# Patient Record
Sex: Male | Born: 1970
Health system: Southern US, Community
[De-identification: ages and names within clinical notes are randomized; demographics above are authoritative.]

## PROBLEM LIST (undated history)

## (undated) DIAGNOSIS — I10 Essential (primary) hypertension: Secondary | ICD-10-CM

## (undated) HISTORY — DX: Essential (primary) hypertension: I10

---

## 2003-02-02 HISTORY — PX: ANTERIOR FUSION CERVICAL SPINE: SUR626

## 2017-09-24 DIAGNOSIS — Z6827 Body mass index (BMI) 27.0-27.9, adult: Secondary | ICD-10-CM | POA: Diagnosis not present

## 2017-09-24 DIAGNOSIS — I10 Essential (primary) hypertension: Secondary | ICD-10-CM | POA: Diagnosis not present

## 2017-09-24 DIAGNOSIS — F17200 Nicotine dependence, unspecified, uncomplicated: Secondary | ICD-10-CM | POA: Diagnosis not present

## 2017-09-26 DIAGNOSIS — M542 Cervicalgia: Secondary | ICD-10-CM | POA: Diagnosis not present

## 2017-09-26 DIAGNOSIS — I10 Essential (primary) hypertension: Secondary | ICD-10-CM | POA: Diagnosis not present

## 2017-09-26 DIAGNOSIS — G2581 Restless legs syndrome: Secondary | ICD-10-CM | POA: Diagnosis not present

## 2017-09-26 DIAGNOSIS — Z6829 Body mass index (BMI) 29.0-29.9, adult: Secondary | ICD-10-CM | POA: Diagnosis not present

## 2017-09-26 DIAGNOSIS — H1012 Acute atopic conjunctivitis, left eye: Secondary | ICD-10-CM | POA: Diagnosis not present

## 2017-12-03 DIAGNOSIS — F17200 Nicotine dependence, unspecified, uncomplicated: Secondary | ICD-10-CM | POA: Diagnosis not present

## 2017-12-03 DIAGNOSIS — M5135 Other intervertebral disc degeneration, thoracolumbar region: Secondary | ICD-10-CM | POA: Diagnosis not present

## 2017-12-03 DIAGNOSIS — W010XXA Fall on same level from slipping, tripping and stumbling without subsequent striking against object, initial encounter: Secondary | ICD-10-CM | POA: Diagnosis not present

## 2017-12-03 DIAGNOSIS — M545 Low back pain: Secondary | ICD-10-CM | POA: Diagnosis not present

## 2017-12-03 DIAGNOSIS — S3992XA Unspecified injury of lower back, initial encounter: Secondary | ICD-10-CM | POA: Diagnosis not present

## 2017-12-06 DIAGNOSIS — M545 Low back pain: Secondary | ICD-10-CM | POA: Diagnosis not present

## 2017-12-06 DIAGNOSIS — Z6830 Body mass index (BMI) 30.0-30.9, adult: Secondary | ICD-10-CM | POA: Diagnosis not present

## 2017-12-20 DIAGNOSIS — M545 Low back pain: Secondary | ICD-10-CM | POA: Diagnosis not present

## 2017-12-26 DIAGNOSIS — M79604 Pain in right leg: Secondary | ICD-10-CM | POA: Diagnosis not present

## 2017-12-26 DIAGNOSIS — M5416 Radiculopathy, lumbar region: Secondary | ICD-10-CM | POA: Diagnosis not present

## 2017-12-26 DIAGNOSIS — M545 Low back pain: Secondary | ICD-10-CM | POA: Diagnosis not present

## 2017-12-26 DIAGNOSIS — M6281 Muscle weakness (generalized): Secondary | ICD-10-CM | POA: Diagnosis not present

## 2017-12-27 DIAGNOSIS — Z0000 Encounter for general adult medical examination without abnormal findings: Secondary | ICD-10-CM | POA: Diagnosis not present

## 2017-12-27 DIAGNOSIS — Z6830 Body mass index (BMI) 30.0-30.9, adult: Secondary | ICD-10-CM | POA: Diagnosis not present

## 2017-12-28 DIAGNOSIS — M79604 Pain in right leg: Secondary | ICD-10-CM | POA: Diagnosis not present

## 2017-12-28 DIAGNOSIS — M6281 Muscle weakness (generalized): Secondary | ICD-10-CM | POA: Diagnosis not present

## 2017-12-28 DIAGNOSIS — M545 Low back pain: Secondary | ICD-10-CM | POA: Diagnosis not present

## 2017-12-28 DIAGNOSIS — M5416 Radiculopathy, lumbar region: Secondary | ICD-10-CM | POA: Diagnosis not present

## 2018-01-05 DIAGNOSIS — M79604 Pain in right leg: Secondary | ICD-10-CM | POA: Diagnosis not present

## 2018-01-05 DIAGNOSIS — M545 Low back pain: Secondary | ICD-10-CM | POA: Diagnosis not present

## 2018-01-05 DIAGNOSIS — M6281 Muscle weakness (generalized): Secondary | ICD-10-CM | POA: Diagnosis not present

## 2018-01-05 DIAGNOSIS — M5416 Radiculopathy, lumbar region: Secondary | ICD-10-CM | POA: Diagnosis not present

## 2018-01-11 DIAGNOSIS — M79604 Pain in right leg: Secondary | ICD-10-CM | POA: Diagnosis not present

## 2018-01-11 DIAGNOSIS — M545 Low back pain: Secondary | ICD-10-CM | POA: Diagnosis not present

## 2018-01-11 DIAGNOSIS — M6281 Muscle weakness (generalized): Secondary | ICD-10-CM | POA: Diagnosis not present

## 2018-01-11 DIAGNOSIS — M5416 Radiculopathy, lumbar region: Secondary | ICD-10-CM | POA: Diagnosis not present

## 2018-01-12 DIAGNOSIS — M6281 Muscle weakness (generalized): Secondary | ICD-10-CM | POA: Diagnosis not present

## 2018-01-12 DIAGNOSIS — M5416 Radiculopathy, lumbar region: Secondary | ICD-10-CM | POA: Diagnosis not present

## 2018-01-12 DIAGNOSIS — M79604 Pain in right leg: Secondary | ICD-10-CM | POA: Diagnosis not present

## 2018-01-12 DIAGNOSIS — M545 Low back pain: Secondary | ICD-10-CM | POA: Diagnosis not present

## 2018-01-19 DIAGNOSIS — M6281 Muscle weakness (generalized): Secondary | ICD-10-CM | POA: Diagnosis not present

## 2018-01-19 DIAGNOSIS — M545 Low back pain: Secondary | ICD-10-CM | POA: Diagnosis not present

## 2018-01-19 DIAGNOSIS — M79604 Pain in right leg: Secondary | ICD-10-CM | POA: Diagnosis not present

## 2018-01-19 DIAGNOSIS — M5416 Radiculopathy, lumbar region: Secondary | ICD-10-CM | POA: Diagnosis not present

## 2018-01-24 DIAGNOSIS — M5416 Radiculopathy, lumbar region: Secondary | ICD-10-CM | POA: Diagnosis not present

## 2018-01-24 DIAGNOSIS — M6281 Muscle weakness (generalized): Secondary | ICD-10-CM | POA: Diagnosis not present

## 2018-01-24 DIAGNOSIS — M79604 Pain in right leg: Secondary | ICD-10-CM | POA: Diagnosis not present

## 2018-01-24 DIAGNOSIS — M545 Low back pain: Secondary | ICD-10-CM | POA: Diagnosis not present

## 2018-01-26 DIAGNOSIS — M6281 Muscle weakness (generalized): Secondary | ICD-10-CM | POA: Diagnosis not present

## 2018-01-26 DIAGNOSIS — M79604 Pain in right leg: Secondary | ICD-10-CM | POA: Diagnosis not present

## 2018-01-26 DIAGNOSIS — M5416 Radiculopathy, lumbar region: Secondary | ICD-10-CM | POA: Diagnosis not present

## 2018-01-26 DIAGNOSIS — M545 Low back pain: Secondary | ICD-10-CM | POA: Diagnosis not present

## 2018-01-30 DIAGNOSIS — M545 Low back pain: Secondary | ICD-10-CM | POA: Diagnosis not present

## 2018-01-30 DIAGNOSIS — M5416 Radiculopathy, lumbar region: Secondary | ICD-10-CM | POA: Diagnosis not present

## 2018-01-30 DIAGNOSIS — M79604 Pain in right leg: Secondary | ICD-10-CM | POA: Diagnosis not present

## 2018-01-30 DIAGNOSIS — M6281 Muscle weakness (generalized): Secondary | ICD-10-CM | POA: Diagnosis not present

## 2018-02-02 DIAGNOSIS — M545 Low back pain: Secondary | ICD-10-CM | POA: Diagnosis not present

## 2018-02-02 DIAGNOSIS — M5416 Radiculopathy, lumbar region: Secondary | ICD-10-CM | POA: Diagnosis not present

## 2018-02-02 DIAGNOSIS — M79604 Pain in right leg: Secondary | ICD-10-CM | POA: Diagnosis not present

## 2018-02-02 DIAGNOSIS — M6281 Muscle weakness (generalized): Secondary | ICD-10-CM | POA: Diagnosis not present

## 2018-03-29 DIAGNOSIS — I10 Essential (primary) hypertension: Secondary | ICD-10-CM | POA: Diagnosis not present

## 2018-03-29 DIAGNOSIS — Z6831 Body mass index (BMI) 31.0-31.9, adult: Secondary | ICD-10-CM | POA: Diagnosis not present

## 2018-03-29 DIAGNOSIS — E6609 Other obesity due to excess calories: Secondary | ICD-10-CM | POA: Diagnosis not present

## 2018-03-29 DIAGNOSIS — M545 Low back pain: Secondary | ICD-10-CM | POA: Diagnosis not present

## 2018-05-09 DIAGNOSIS — Z6832 Body mass index (BMI) 32.0-32.9, adult: Secondary | ICD-10-CM | POA: Diagnosis not present

## 2018-05-09 DIAGNOSIS — M542 Cervicalgia: Secondary | ICD-10-CM | POA: Diagnosis not present

## 2018-05-10 DIAGNOSIS — M542 Cervicalgia: Secondary | ICD-10-CM | POA: Diagnosis not present

## 2018-06-28 DIAGNOSIS — Z6831 Body mass index (BMI) 31.0-31.9, adult: Secondary | ICD-10-CM | POA: Diagnosis not present

## 2018-06-28 DIAGNOSIS — I10 Essential (primary) hypertension: Secondary | ICD-10-CM | POA: Diagnosis not present

## 2018-07-02 DIAGNOSIS — T447X6D Underdosing of beta-adrenoreceptor antagonists, subsequent encounter: Secondary | ICD-10-CM | POA: Diagnosis not present

## 2018-07-02 DIAGNOSIS — F17200 Nicotine dependence, unspecified, uncomplicated: Secondary | ICD-10-CM | POA: Diagnosis not present

## 2018-07-02 DIAGNOSIS — I10 Essential (primary) hypertension: Secondary | ICD-10-CM | POA: Diagnosis not present

## 2018-07-02 DIAGNOSIS — R480 Dyslexia and alexia: Secondary | ICD-10-CM | POA: Diagnosis not present

## 2018-07-02 DIAGNOSIS — M5416 Radiculopathy, lumbar region: Secondary | ICD-10-CM | POA: Diagnosis not present

## 2018-07-02 DIAGNOSIS — F320 Major depressive disorder, single episode, mild: Secondary | ICD-10-CM | POA: Diagnosis not present

## 2018-07-02 DIAGNOSIS — M4322 Fusion of spine, cervical region: Secondary | ICD-10-CM | POA: Diagnosis not present

## 2018-07-02 DIAGNOSIS — E669 Obesity, unspecified: Secondary | ICD-10-CM | POA: Diagnosis not present

## 2018-07-02 DIAGNOSIS — D224 Melanocytic nevi of scalp and neck: Secondary | ICD-10-CM | POA: Diagnosis not present

## 2018-09-28 DIAGNOSIS — Z131 Encounter for screening for diabetes mellitus: Secondary | ICD-10-CM | POA: Diagnosis not present

## 2018-09-28 DIAGNOSIS — Z1389 Encounter for screening for other disorder: Secondary | ICD-10-CM | POA: Diagnosis not present

## 2018-09-28 DIAGNOSIS — Z6832 Body mass index (BMI) 32.0-32.9, adult: Secondary | ICD-10-CM | POA: Diagnosis not present

## 2018-09-28 DIAGNOSIS — Z125 Encounter for screening for malignant neoplasm of prostate: Secondary | ICD-10-CM | POA: Diagnosis not present

## 2018-09-28 DIAGNOSIS — Z0000 Encounter for general adult medical examination without abnormal findings: Secondary | ICD-10-CM | POA: Diagnosis not present

## 2018-09-28 DIAGNOSIS — I10 Essential (primary) hypertension: Secondary | ICD-10-CM | POA: Diagnosis not present

## 2018-10-13 DIAGNOSIS — Z1211 Encounter for screening for malignant neoplasm of colon: Secondary | ICD-10-CM | POA: Diagnosis not present

## 2018-12-03 HISTORY — PX: COLONOSCOPY: SHX174

## 2018-12-11 DIAGNOSIS — Z01818 Encounter for other preprocedural examination: Secondary | ICD-10-CM | POA: Diagnosis not present

## 2018-12-13 DIAGNOSIS — D509 Iron deficiency anemia, unspecified: Secondary | ICD-10-CM | POA: Diagnosis not present

## 2018-12-13 DIAGNOSIS — Z539 Procedure and treatment not carried out, unspecified reason: Secondary | ICD-10-CM | POA: Diagnosis not present

## 2018-12-13 DIAGNOSIS — Z1211 Encounter for screening for malignant neoplasm of colon: Secondary | ICD-10-CM | POA: Diagnosis not present

## 2018-12-13 DIAGNOSIS — K2950 Unspecified chronic gastritis without bleeding: Secondary | ICD-10-CM | POA: Diagnosis not present

## 2018-12-25 ENCOUNTER — Other Ambulatory Visit: Payer: Self-pay | Admitting: Surgery

## 2018-12-25 DIAGNOSIS — Z1211 Encounter for screening for malignant neoplasm of colon: Secondary | ICD-10-CM

## 2019-01-09 ENCOUNTER — Encounter: Payer: Self-pay | Admitting: Gastroenterology

## 2019-01-31 NOTE — Progress Notes (Signed)
Referring Provider: Adelina Mings, MD Primary Care Physician:  Neale Burly, MD Primary Gastroenterologist:  Dr. Oneida Alar  Chief Complaint  Patient presents with  . Colonoscopy    HPI:   Caleb Richardson is a 48 y.o. male presenting today at the request of  Adelina Mings, MD for consult TCS. Previous incomplete colonoscopy at [facility] virtual colonoscopy was ordered.  Unable to view records.  Reviewed colonoscopy records.  Colonoscopy attempted on 12/13/2018 by Dr. Adelina Mings.  Impression states normal colonoscopy, retroflexed views revealed no abnormalities, incomplete colonoscopy.  Switching to a pediatric scope in position changes, could only advance the scope to a sending colon.  Recommended referral to GI for repeat colonoscopy.  Today: No complications with colonoscopy. States he thinks he was still passing formed stool after his prep. No history of constipation. Typically BM daily. Sometimes 2-3 times a day. Stools are soft and formed. No diarrhea. No abdominal pain. No blood in the stool. No black stools. No nausea, vomiting. Rare reflux symptoms if eating spaghetti. Otherwise, without heartburn/reflux. No dysphagia. Patient states he did not have the CT colonoscopy.   BP is elevated. States he gets anxious when he comes to the doctor. No HA or blurry vision. No CP.   Past Medical History:  Diagnosis Date  . HTN (hypertension)     Past Surgical History:  Procedure Laterality Date  . ANTERIOR FUSION CERVICAL SPINE  2005   c3-c5 fusion  . COLONOSCOPY  12/2018   [facility]; incomplete; normal to the ascending colon.     Current Outpatient Medications  Medication Sig Dispense Refill  . amLODipine (NORVASC) 5 MG tablet Take 5 mg by mouth daily.    . bisoprolol-hydrochlorothiazide (ZIAC) 10-6.25 MG tablet Take 1 tablet by mouth daily.     No current facility-administered medications for this visit.    Allergies as of 02/01/2019  . (No  Known Allergies)    Family History  Problem Relation Age of Onset  . Colon cancer Neg Hx     Social History   Socioeconomic History  . Marital status: Divorced    Spouse name: Not on file  . Number of children: Not on file  . Years of education: Not on file  . Highest education level: Not on file  Occupational History  . Not on file  Tobacco Use  . Smoking status: Current Every Day Smoker    Packs/day: 0.50  . Smokeless tobacco: Former Network engineer and Sexual Activity  . Alcohol use: Not Currently    Comment: no alcohol in 1-2 years.   . Drug use: Never  . Sexual activity: Not on file  Other Topics Concern  . Not on file  Social History Narrative  . Not on file   Social Determinants of Health   Financial Resource Strain:   . Difficulty of Paying Living Expenses: Not on file  Food Insecurity:   . Worried About Charity fundraiser in the Last Year: Not on file  . Ran Out of Food in the Last Year: Not on file  Transportation Needs:   . Lack of Transportation (Medical): Not on file  . Lack of Transportation (Non-Medical): Not on file  Physical Activity:   . Days of Exercise per Week: Not on file  . Minutes of Exercise per Session: Not on file  Stress:   . Feeling of Stress : Not on file  Social Connections:   . Frequency of Communication with Friends and  Family: Not on file  . Frequency of Social Gatherings with Friends and Family: Not on file  . Attends Religious Services: Not on file  . Active Member of Clubs or Organizations: Not on file  . Attends Archivist Meetings: Not on file  . Marital Status: Not on file  Intimate Partner Violence:   . Fear of Current or Ex-Partner: Not on file  . Emotionally Abused: Not on file  . Physically Abused: Not on file  . Sexually Abused: Not on file    Review of Systems: Gen: Denies any fever, chills, lightheadedness, dizziness, presyncope, or syncope. HEENT: Denies nasal congestion or sore throat. CV:  Denies chest pain or heart palpitations Resp: Denies shortness of breath or cough.  GI: See HPI GU : Denies urinary burning, urinary frequency, urinary hesitancy MS: Denies joint pain Derm: Denies rash Psych: Denies depression. Occasional anxiety.  Heme: Denies bruising, bleeding.   Physical Exam: BP (!) 160/90   Pulse 94   Temp (!) 97.3 F (36.3 C) (Oral)   Ht 6' (1.829 m)   Wt 240 lb 9.6 oz (109.1 kg)   BMI 32.63 kg/m  General:   Alert and oriented. Pleasant and cooperative. Well-nourished and well-developed.  Head:  Normocephalic and atraumatic. Eyes:  Without icterus, sclera clear and conjunctiva pink.  Ears:  Normal auditory acuity. Lungs:  Clear to auscultation bilaterally. No wheezes, rales, or rhonchi. No distress.  Heart:  S1, S2 present without murmurs appreciated.  Abdomen:  +BS, soft, non-tender and non-distended. No HSM noted. No guarding or rebound. No masses appreciated.  Rectal:  Deferred  Msk:  Symmetrical without gross deformities. Normal posture. Extremities:  Without edema. Neurologic:  Alert and  oriented x4;  grossly normal neurologically. Skin:  Intact without significant lesions or rashes. Psych: Normal mood and affect.

## 2019-02-01 ENCOUNTER — Ambulatory Visit (INDEPENDENT_AMBULATORY_CARE_PROVIDER_SITE_OTHER): Payer: Medicare PPO | Admitting: Gastroenterology

## 2019-02-01 ENCOUNTER — Telehealth: Payer: Self-pay | Admitting: *Deleted

## 2019-02-01 ENCOUNTER — Other Ambulatory Visit: Payer: Self-pay

## 2019-02-01 ENCOUNTER — Telehealth: Payer: Self-pay | Admitting: Gastroenterology

## 2019-02-01 ENCOUNTER — Encounter: Payer: Self-pay | Admitting: Gastroenterology

## 2019-02-01 DIAGNOSIS — Z1211 Encounter for screening for malignant neoplasm of colon: Secondary | ICD-10-CM

## 2019-02-01 NOTE — Assessment & Plan Note (Addendum)
48 year old male with past medical history of hypertension presenting at the request of Dr. Adelina Mings to repeat colonoscopy for colon cancer screening.  Patient had colonoscopy on 12/13/2018 performed by Dr. Rocco Serene at [facility] that was normal to the ascending colon but could not advance the scope further.  No explanation as to why.  Patient does report that he was still passing some formed stool after completing his colon prep.  No history of constipation.  Typically having daily BMs, sometimes 2-3.  No significant upper or lower GI symptoms.  No alarm symptoms.  No family history of colon cancer. Appears he was going to be scheduled for CT virtual colonoscopy but patient states he did not have this completed.   At this time, will proceed with colonoscopy with Dr. Oneida Alar in the near future. The risks, benefits, and alternatives have been discussed in detail with patient. They have stated understanding and desire to proceed.  Due to possible poor prep, will have patient complete an additional day of clear liquids as well as additional 1/2 prep. Follow-up as recommended at the time of colonoscopy.  Of note, patient's blood pressure was elevated today at 160/90.  States he is anxious when coming to doctors offices.  No concerning symptoms from his elevated BP.  He was advised to follow-up with his primary care ASAP as this needs to be better managed.  Advised he get a automated BP cuff so he can monitor his blood pressure at home.  Will have clinical staff call 4 weeks prior to his colonoscopy to check on BP.

## 2019-02-01 NOTE — Telephone Encounter (Signed)
error 

## 2019-02-01 NOTE — Telephone Encounter (Signed)
Pt was returning a call. [phone]

## 2019-02-01 NOTE — Telephone Encounter (Signed)
LMOVM

## 2019-02-01 NOTE — Telephone Encounter (Signed)
See prior note

## 2019-02-01 NOTE — Patient Instructions (Signed)
Please call your primary care ASAP to follow-up on your elevated blood pressure.  This is going to need to be better managed by the time of your colonoscopy.  Advised that you pick up a automated blood pressure cuff from [facility] or other pharmacy to keep a check on your blood pressures at home.  We will get you scheduled for your colonoscopy in the near future with Dr. Oneida Alar.  I will have you complete an extended prep as you reported still having formed stool after completing your prep.  You will have an extra day of clear liquids and complete additional colon prep.  See separate instructions for this.  We will follow up with you as recommended at the time of colonoscopy.  Aliene Altes, PA-C [facility]

## 2019-02-01 NOTE — Telephone Encounter (Signed)
LMOVM for pt. Patient scheduled for CT virtual TCS 02/14/2019. Per [facility] patient can decided if he wants to continue with this or for TCS we have scheduled with [facility] on 05/17/2019

## 2019-02-05 NOTE — Telephone Encounter (Signed)
Noted  

## 2019-02-05 NOTE — Telephone Encounter (Signed)
Spoke with patient. He states he is going to stick with the virtual CT TCS that is already scheduled for 02/20/2019. He wants [place] to cancel what we have him down for with our office. FYI to [facility]

## 2019-02-05 NOTE — Telephone Encounter (Signed)
lmovm

## 2019-02-08 ENCOUNTER — Other Ambulatory Visit: Payer: Self-pay | Admitting: Surgery

## 2019-02-14 ENCOUNTER — Inpatient Hospital Stay: Admission: RE | Admit: 2019-02-14 | Payer: Self-pay | Source: Ambulatory Visit

## 2019-02-20 ENCOUNTER — Ambulatory Visit
Admission: RE | Admit: 2019-02-20 | Discharge: 2019-02-20 | Disposition: A | Discharge status: Home / Self Care | Payer: Medicare Other | Source: Ambulatory Visit | Attending: Surgery | Admitting: Surgery

## 2019-02-20 ENCOUNTER — Other Ambulatory Visit: Payer: Self-pay

## 2019-02-20 DIAGNOSIS — Z1211 Encounter for screening for malignant neoplasm of colon: Secondary | ICD-10-CM

## 2019-02-21 DIAGNOSIS — Z0000 Encounter for general adult medical examination without abnormal findings: Secondary | ICD-10-CM | POA: Diagnosis not present

## 2019-02-21 DIAGNOSIS — I10 Essential (primary) hypertension: Secondary | ICD-10-CM | POA: Diagnosis not present

## 2019-02-26 DIAGNOSIS — Z041 Encounter for examination and observation following transport accident: Secondary | ICD-10-CM | POA: Diagnosis not present

## 2019-02-26 DIAGNOSIS — I10 Essential (primary) hypertension: Secondary | ICD-10-CM | POA: Diagnosis not present

## 2019-02-26 DIAGNOSIS — Z79899 Other long term (current) drug therapy: Secondary | ICD-10-CM | POA: Diagnosis not present

## 2019-05-16 ENCOUNTER — Other Ambulatory Visit (HOSPITAL_COMMUNITY): Payer: Medicare PPO

## 2019-06-06 DIAGNOSIS — I10 Essential (primary) hypertension: Secondary | ICD-10-CM | POA: Diagnosis not present

## 2019-09-27 ENCOUNTER — Telehealth: Payer: Self-pay | Admitting: Gastroenterology

## 2019-09-27 DIAGNOSIS — I10 Essential (primary) hypertension: Secondary | ICD-10-CM | POA: Diagnosis not present

## 2019-09-27 DIAGNOSIS — Z131 Encounter for screening for diabetes mellitus: Secondary | ICD-10-CM | POA: Diagnosis not present

## 2019-09-27 NOTE — Telephone Encounter (Signed)
Reviewed chart- Kristen saw pt and wanted to schedule a tcs with Dr.Fields. pt was referred to have a virtual tcs by [facility] surgical specialist in [place]. And the pt chose virtual tcs instead of tcs with Dr.Fields. see phone notes.  We would not have gotten those results because we did not order it. I tried to call the pt back, NA- LM on voicemail. Advised him on message that he needs to call [facility] surgical specialist in [place] since they are the ones that ordered the test. Routing to [facility] as [employer]

## 2019-09-27 NOTE — Telephone Encounter (Signed)
Spoke with the pt and he is aware to call [facility]. I gave him the number to call.

## 2019-09-27 NOTE — Telephone Encounter (Signed)
PATIENT CAME INTO THE OFFICE AND SAID THAT WE SENT HIM TO [facility] LAST YEAR FOR A COLONOSCOPY AND THAT HE NEVER HEARD RESULTS.  I ASKED THE PATIENT IF HE CONTACTED THEM AND HE SAID HE WAS TOLD IT WAS STILL "BEING PROCESSED" [phone]

## 2019-09-27 NOTE — Telephone Encounter (Signed)
Noted. Agree with recommendations.  

## 2019-10-03 ENCOUNTER — Telehealth: Payer: Self-pay | Admitting: Internal Medicine

## 2019-10-03 NOTE — Telephone Encounter (Signed)
Pt said he was in the office recently and was wondering if he could go ahead and schedule his colonoscopy now. I told him the scheduler would be in touch with him once the doctor's schedule is available. [phone]

## 2019-10-04 NOTE — Telephone Encounter (Signed)
See other phone note from 09/27/19. Pt's last OV was 01/2019.

## 2019-12-13 ENCOUNTER — Telehealth (INDEPENDENT_AMBULATORY_CARE_PROVIDER_SITE_OTHER): Payer: Self-pay

## 2019-12-13 ENCOUNTER — Ambulatory Visit (INDEPENDENT_AMBULATORY_CARE_PROVIDER_SITE_OTHER): Payer: Medicare Other | Admitting: Gastroenterology

## 2019-12-13 NOTE — Telephone Encounter (Signed)
Patient no showed for her appointment on 12/13/2019.

## 2019-12-14 NOTE — Telephone Encounter (Signed)
noted 

## 2020-01-22 DIAGNOSIS — I10 Essential (primary) hypertension: Secondary | ICD-10-CM | POA: Diagnosis not present

## 2020-01-22 DIAGNOSIS — Z0000 Encounter for general adult medical examination without abnormal findings: Secondary | ICD-10-CM | POA: Diagnosis not present

## 2020-03-13 ENCOUNTER — Telehealth (INDEPENDENT_AMBULATORY_CARE_PROVIDER_SITE_OTHER): Payer: Self-pay

## 2020-03-13 ENCOUNTER — Encounter (INDEPENDENT_AMBULATORY_CARE_PROVIDER_SITE_OTHER): Payer: Self-pay | Admitting: Gastroenterology

## 2020-03-13 ENCOUNTER — Encounter (INDEPENDENT_AMBULATORY_CARE_PROVIDER_SITE_OTHER): Payer: Self-pay

## 2020-03-13 ENCOUNTER — Other Ambulatory Visit: Payer: Self-pay

## 2020-03-13 ENCOUNTER — Other Ambulatory Visit (INDEPENDENT_AMBULATORY_CARE_PROVIDER_SITE_OTHER): Payer: Self-pay

## 2020-03-13 ENCOUNTER — Ambulatory Visit (INDEPENDENT_AMBULATORY_CARE_PROVIDER_SITE_OTHER): Payer: Medicare HMO | Admitting: Gastroenterology

## 2020-03-13 VITALS — BP 135/95 | HR 103 | Temp 98.3°F | Ht 72.0 in | Wt 239.0 lb

## 2020-03-13 DIAGNOSIS — Z7189 Other specified counseling: Secondary | ICD-10-CM

## 2020-03-13 DIAGNOSIS — Z1211 Encounter for screening for malignant neoplasm of colon: Secondary | ICD-10-CM

## 2020-03-13 MED ORDER — PEG 3350-KCL-NA BICARB-NACL 420 G PO SOLR: 4000.0000 mL | ORAL | 0 refills | Status: DC

## 2020-03-13 NOTE — H&P (View-Only) (Signed)
Maylon Peppers, M.D. Gastroenterology & Hepatology [facility] [address] [place], [place] [address] Primary Care Physician: Neale Burly, MD [place] [address]  Referring MD: PCP  Chief Complaint: Incomplete colonoscopy  History of Present Illness: Caleb Richardson is a 50 y.o. male with PMH HTN, who presents for evaluation of incomplete colonoscopy.  Patient denies having any complaints at the moment.  He states that on a regular day he has up to 3 BM which have normal consistency. Patient reports that for the last year he has presented up to 5 bowel movements when he eats chocolate, but it has normal consistency as well.  He denies having any discomfort such as abdominal pain or dyschezia with these bowel movements. The patient denies having any nausea, vomiting, fever, chills, hematochezia, melena, hematemesis, abdominal distention, abdominal pain, jaundice, pruritus or weight loss.  Last EGD: never. Last Colonoscopy:12/13/2018 - performed at [facility] Dr. Jory Sims, incomplete colonoscopy, visualized only until the ascending colon, this was followed by CT virtual colonoscopy on 02/20/2019 which had an incomplete prep but no large lesions were observed.  FHx: neg for any gastrointestinal/liver disease, no malignancies Social: smokes 1/2 packs since 50 years old, neg alcohol or illicit drug use Surgical: no abdominal surgeries  Past Medical History: Past Medical History:  Diagnosis Date  . HTN (hypertension)     Past Surgical History: Past Surgical History:  Procedure Laterality Date  . ANTERIOR FUSION CERVICAL SPINE  2005   c3-c5 fusion  . COLONOSCOPY  12/2018   [facility]; incomplete; normal to the ascending colon.     Family History: Family History  Problem Relation Age of Onset  . Diabetes Mother   . Healthy Father   . Colon cancer Neg Hx     Social History: Social History    Tobacco Use  Smoking Status Current Every Day Smoker  . Packs/day: 0.50  Smokeless Tobacco Former Systems developer  . Types: Snuff   Social History   Substance and Sexual Activity  Alcohol Use Not Currently   Comment: no alcohol in 1-2 years.    Social History   Substance and Sexual Activity  Drug Use Never    Allergies: Allergies  Allergen Reactions  . Avocado Anaphylaxis  . Bee Pollen Photosensitivity    Watery eyes  . Dust Mite Extract   . Mixed Ragweed Photosensitivity    Medications: Current Outpatient Medications  Medication Sig Dispense Refill  . amLODipine (NORVASC) 5 MG tablet Take 10 mg by mouth daily.    . bisoprolol-hydrochlorothiazide (ZIAC) 10-6.25 MG tablet Take 1 tablet by mouth daily. (Patient not taking: Reported on 03/13/2020)     No current facility-administered medications for this visit.    Review of Systems: GENERAL: negative for malaise, night sweats HEENT: No changes in hearing or vision, no nose bleeds or other nasal problems. NECK: Negative for lumps, goiter, pain and significant neck swelling RESPIRATORY: Negative for cough, wheezing CARDIOVASCULAR: Negative for chest pain, leg swelling, palpitations, orthopnea GI: SEE HPI MUSCULOSKELETAL: Negative for joint pain or swelling, back pain, and muscle pain. SKIN: Negative for lesions, rash PSYCH: Negative for sleep disturbance, mood disorder and recent psychosocial stressors. HEMATOLOGY Negative for prolonged bleeding, bruising easily, and swollen nodes. ENDOCRINE: Negative for cold or heat intolerance, polyuria, polydipsia and goiter. NEURO: negative for tremor, gait imbalance, syncope and seizures. The remainder of the review of systems is noncontributory.   Physical Exam: BP (!) 135/95 (BP Location: Left  Arm, Patient Position: Sitting, Cuff Size: Large)   Pulse (!) 103   Temp 98.3 F (36.8 C) (Oral)   Ht 6' (1.829 m)   Wt 239 lb (108.4 kg)   BMI 32.41 kg/m  GENERAL: The patient is AO x3,  in no acute distress. Obese. HEENT: Head is normocephalic and atraumatic. EOMI are intact. Mouth is well hydrated and without lesions. NECK: Supple. No masses LUNGS: Clear to auscultation. No presence of rhonchi/wheezing/rales. Adequate chest expansion HEART: RRR, normal s1 and s2. ABDOMEN: Soft, nontender, no guarding, no peritoneal signs, and nondistended. BS +. No masses. EXTREMITIES: Without any cyanosis, clubbing, rash, lesions or edema. NEUROLOGIC: AOx3, no focal motor deficit. SKIN: no jaundice, no rashes   Imaging/Labs: as above  I personally reviewed and interpreted the available labs, imaging and endoscopic files.  Impression and Plan: Caleb Richardson is a 50 y.o. male with PMH HTN, who presents for evaluation of incomplete colonoscopy.  Patient had visualization of the colon up to the ascending colon but the cecum could not be completely visualized.  He also had virtual colonoscopy that suboptimal prep.  Due to this, we will attempt a repeat colonoscopy at [facility].  More than 50% of the office visit was dedicated to discussing the procedure, including the day of and risks involved. Patient understands what the procedure involves including the benefits and any risks. Patient understands alternatives to the proposed procedure. Risks including (but not limited to) bleeding, tearing of the lining (perforation), rupture of adjacent organs, problems with heart and lung function, infection, and medication reactions. A small percentage of complications may require surgery, hospitalization, repeat endoscopic procedure, and/or transfusion. A small percentage of polyps and other tumors may not be seen. Patient understood and agreed.  - Schedule colonoscopy  All questions were answered.      Maylon Peppers, MD Gastroenterology and Hepatology [facility]

## 2020-03-13 NOTE — Telephone Encounter (Signed)
LeighAnn Nicodemus Denk, CMA  

## 2020-03-13 NOTE — Progress Notes (Signed)
Caleb Richardson, M.D. Gastroenterology & Hepatology [facility] [address] [place], [place] [address] Primary Care Physician: Caleb Burly, MD [place] [address]  Referring MD: PCP  Chief Complaint: Incomplete colonoscopy  History of Present Illness: Caleb Richardson is a 50 y.o. male with PMH HTN, who presents for evaluation of incomplete colonoscopy.  Patient denies having any complaints at the moment.  He states that on a regular day he has up to 3 BM which have normal consistency. Patient reports that for the last year he has presented up to 5 bowel movements when he eats chocolate, but it has normal consistency as well.  He denies having any discomfort such as abdominal pain or dyschezia with these bowel movements. The patient denies having any nausea, vomiting, fever, chills, hematochezia, melena, hematemesis, abdominal distention, abdominal pain, jaundice, pruritus or weight loss.  Last EGD: never. Last Colonoscopy:12/13/2018 - performed at [facility] Dr. Jory Richardson, incomplete colonoscopy, visualized only until the ascending colon, this was followed by CT virtual colonoscopy on 02/20/2019 which had an incomplete prep but no large lesions were observed.  FHx: neg for any gastrointestinal/liver disease, no malignancies Social: smokes 1/2 packs since 50 years old, neg alcohol or illicit drug use Surgical: no abdominal surgeries  Past Medical History: Past Medical History:  Diagnosis Date  . HTN (hypertension)     Past Surgical History: Past Surgical History:  Procedure Laterality Date  . ANTERIOR FUSION CERVICAL SPINE  2005   c3-c5 fusion  . COLONOSCOPY  12/2018   [facility]; incomplete; normal to the ascending colon.     Family History: Family History  Problem Relation Age of Onset  . Diabetes Mother   . Healthy Father   . Colon cancer Neg Hx     Social History: Social History    Tobacco Use  Smoking Status Current Every Day Smoker  . Packs/day: 0.50  Smokeless Tobacco Former Systems developer  . Types: Snuff   Social History   Substance and Sexual Activity  Alcohol Use Not Currently   Comment: no alcohol in 1-2 years.    Social History   Substance and Sexual Activity  Drug Use Never    Allergies: Allergies  Allergen Reactions  . Avocado Anaphylaxis  . Bee Pollen Photosensitivity    Watery eyes  . Dust Mite Extract   . Mixed Ragweed Photosensitivity    Medications: Current Outpatient Medications  Medication Sig Dispense Refill  . amLODipine (NORVASC) 5 MG tablet Take 10 mg by mouth daily.    . bisoprolol-hydrochlorothiazide (ZIAC) 10-6.25 MG tablet Take 1 tablet by mouth daily. (Patient not taking: Reported on 03/13/2020)     No current facility-administered medications for this visit.    Review of Systems: GENERAL: negative for malaise, night sweats HEENT: No changes in hearing or vision, no nose bleeds or other nasal problems. NECK: Negative for lumps, goiter, pain and significant neck swelling RESPIRATORY: Negative for cough, wheezing CARDIOVASCULAR: Negative for chest pain, leg swelling, palpitations, orthopnea GI: SEE HPI MUSCULOSKELETAL: Negative for joint pain or swelling, back pain, and muscle pain. SKIN: Negative for lesions, rash PSYCH: Negative for sleep disturbance, mood disorder and recent psychosocial stressors. HEMATOLOGY Negative for prolonged bleeding, bruising easily, and swollen nodes. ENDOCRINE: Negative for cold or heat intolerance, polyuria, polydipsia and goiter. NEURO: negative for tremor, gait imbalance, syncope and seizures. The remainder of the review of systems is noncontributory.   Physical Exam: BP (!) 135/95 (BP Location: Left  Arm, Patient Position: Sitting, Cuff Size: Large)   Pulse (!) 103   Temp 98.3 F (36.8 C) (Oral)   Ht 6' (1.829 m)   Wt 239 lb (108.4 kg)   BMI 32.41 kg/m  GENERAL: The patient is AO x3,  in no acute distress. Obese. HEENT: Head is normocephalic and atraumatic. EOMI are intact. Mouth is well hydrated and without lesions. NECK: Supple. No masses LUNGS: Clear to auscultation. No presence of rhonchi/wheezing/rales. Adequate chest expansion HEART: RRR, normal s1 and s2. ABDOMEN: Soft, nontender, no guarding, no peritoneal signs, and nondistended. BS +. No masses. EXTREMITIES: Without any cyanosis, clubbing, rash, lesions or edema. NEUROLOGIC: AOx3, no focal motor deficit. SKIN: no jaundice, no rashes   Imaging/Labs: as above  I personally reviewed and interpreted the available labs, imaging and endoscopic files.  Impression and Plan: Caleb Richardson is a 50 y.o. male with PMH HTN, who presents for evaluation of incomplete colonoscopy.  Patient had visualization of the colon up to the ascending colon but the cecum could not be completely visualized.  He also had virtual colonoscopy that suboptimal prep.  Due to this, we will attempt a repeat colonoscopy at [facility].  More than 50% of the office visit was dedicated to discussing the procedure, including the day of and risks involved. Patient understands what the procedure involves including the benefits and any risks. Patient understands alternatives to the proposed procedure. Risks including (but not limited to) bleeding, tearing of the lining (perforation), rupture of adjacent organs, problems with heart and lung function, infection, and medication reactions. A small percentage of complications may require surgery, hospitalization, repeat endoscopic procedure, and/or transfusion. A small percentage of polyps and other tumors may not be seen. Patient understood and agreed.  - Schedule colonoscopy  All questions were answered.      Caleb Peppers, MD Gastroenterology and Hepatology [facility]

## 2020-03-24 ENCOUNTER — Telehealth (INDEPENDENT_AMBULATORY_CARE_PROVIDER_SITE_OTHER): Payer: Self-pay

## 2020-03-24 ENCOUNTER — Other Ambulatory Visit: Payer: Self-pay

## 2020-03-24 ENCOUNTER — Encounter (HOSPITAL_COMMUNITY): Payer: Self-pay | Admitting: Gastroenterology

## 2020-03-24 ENCOUNTER — Other Ambulatory Visit (HOSPITAL_COMMUNITY)
Admission: RE | Admit: 2020-03-24 | Discharge: 2020-03-24 | Disposition: A | Discharge status: Home / Self Care | Payer: Medicare HMO | Source: Ambulatory Visit | Attending: Gastroenterology | Admitting: Gastroenterology

## 2020-03-24 DIAGNOSIS — D124 Benign neoplasm of descending colon: Secondary | ICD-10-CM | POA: Diagnosis not present

## 2020-03-24 DIAGNOSIS — Z833 Family history of diabetes mellitus: Secondary | ICD-10-CM | POA: Diagnosis not present

## 2020-03-24 DIAGNOSIS — Z79899 Other long term (current) drug therapy: Secondary | ICD-10-CM | POA: Diagnosis not present

## 2020-03-24 DIAGNOSIS — K5520 Angiodysplasia of colon without hemorrhage: Secondary | ICD-10-CM | POA: Diagnosis not present

## 2020-03-24 DIAGNOSIS — K648 Other hemorrhoids: Secondary | ICD-10-CM | POA: Diagnosis not present

## 2020-03-24 DIAGNOSIS — Z981 Arthrodesis status: Secondary | ICD-10-CM | POA: Diagnosis not present

## 2020-03-24 DIAGNOSIS — R69 Illness, unspecified: Secondary | ICD-10-CM | POA: Diagnosis not present

## 2020-03-24 DIAGNOSIS — D120 Benign neoplasm of cecum: Secondary | ICD-10-CM | POA: Diagnosis not present

## 2020-03-24 DIAGNOSIS — F17210 Nicotine dependence, cigarettes, uncomplicated: Secondary | ICD-10-CM | POA: Diagnosis not present

## 2020-03-24 DIAGNOSIS — Z20822 Contact with and (suspected) exposure to covid-19: Secondary | ICD-10-CM | POA: Insufficient documentation

## 2020-03-24 DIAGNOSIS — Z01812 Encounter for preprocedural laboratory examination: Secondary | ICD-10-CM | POA: Insufficient documentation

## 2020-03-24 DIAGNOSIS — Z1211 Encounter for screening for malignant neoplasm of colon: Secondary | ICD-10-CM | POA: Diagnosis not present

## 2020-03-24 DIAGNOSIS — I10 Essential (primary) hypertension: Secondary | ICD-10-CM | POA: Diagnosis not present

## 2020-03-24 LAB — SARS CORONAVIRUS 2 (TAT 6-24 HRS): SARS Coronavirus 2: NEGATIVE

## 2020-03-24 LAB — BASIC METABOLIC PANEL
Anion gap: 8 (ref 5–15)
BUN: 14 mg/dL (ref 6–20)
CO2: 28 mmol/L (ref 22–32)
Calcium: 9.2 mg/dL (ref 8.9–10.3)
Chloride: 107 mmol/L (ref 98–111)
Creatinine, Ser: 0.99 mg/dL (ref 0.61–1.24)
GFR, Estimated: >60 mL/min (ref >60)
Glucose, Bld: 105 mg/dL — ABNORMAL HIGH (ref 70–99)
Potassium: 4 mmol/L (ref 3.5–5.1)
Sodium: 143 mmol/L (ref 135–145)

## 2020-03-24 MED ORDER — PEG 3350-KCL-NA BICARB-NACL 420 G PO SOLR: 4000.0000 mL | ORAL | 0 refills | Status: DC

## 2020-03-24 NOTE — Telephone Encounter (Signed)
Caleb Richardson, CMA  

## 2020-03-25 ENCOUNTER — Ambulatory Visit (HOSPITAL_COMMUNITY)
Admission: RE | Admit: 2020-03-25 | Discharge: 2020-03-25 | Disposition: A | Discharge status: Home / Self Care | Payer: Medicare HMO | Attending: Gastroenterology | Admitting: Gastroenterology

## 2020-03-25 ENCOUNTER — Ambulatory Visit (HOSPITAL_COMMUNITY): Payer: Medicare HMO | Admitting: Certified Registered Nurse Anesthetist

## 2020-03-25 ENCOUNTER — Encounter (HOSPITAL_COMMUNITY)
Admission: RE | Disposition: A | Discharge status: Home / Self Care | Payer: Self-pay | Source: Home / Self Care | Attending: Gastroenterology

## 2020-03-25 ENCOUNTER — Other Ambulatory Visit: Payer: Self-pay

## 2020-03-25 ENCOUNTER — Encounter (HOSPITAL_COMMUNITY): Payer: Self-pay | Admitting: Gastroenterology

## 2020-03-25 DIAGNOSIS — K648 Other hemorrhoids: Secondary | ICD-10-CM

## 2020-03-25 DIAGNOSIS — D123 Benign neoplasm of transverse colon: Secondary | ICD-10-CM

## 2020-03-25 DIAGNOSIS — Z981 Arthrodesis status: Secondary | ICD-10-CM | POA: Diagnosis not present

## 2020-03-25 DIAGNOSIS — Z79899 Other long term (current) drug therapy: Secondary | ICD-10-CM | POA: Diagnosis not present

## 2020-03-25 DIAGNOSIS — Z1211 Encounter for screening for malignant neoplasm of colon: Secondary | ICD-10-CM | POA: Diagnosis not present

## 2020-03-25 DIAGNOSIS — Z833 Family history of diabetes mellitus: Secondary | ICD-10-CM | POA: Diagnosis not present

## 2020-03-25 DIAGNOSIS — D120 Benign neoplasm of cecum: Secondary | ICD-10-CM | POA: Diagnosis not present

## 2020-03-25 DIAGNOSIS — D124 Benign neoplasm of descending colon: Secondary | ICD-10-CM

## 2020-03-25 DIAGNOSIS — F17210 Nicotine dependence, cigarettes, uncomplicated: Secondary | ICD-10-CM | POA: Insufficient documentation

## 2020-03-25 DIAGNOSIS — K635 Polyp of colon: Secondary | ICD-10-CM | POA: Diagnosis not present

## 2020-03-25 DIAGNOSIS — K5520 Angiodysplasia of colon without hemorrhage: Secondary | ICD-10-CM | POA: Diagnosis not present

## 2020-03-25 DIAGNOSIS — R69 Illness, unspecified: Secondary | ICD-10-CM | POA: Diagnosis not present

## 2020-03-25 DIAGNOSIS — I10 Essential (primary) hypertension: Secondary | ICD-10-CM | POA: Diagnosis not present

## 2020-03-25 HISTORY — PX: COLONOSCOPY WITH PROPOFOL: SHX5780

## 2020-03-25 HISTORY — PX: POLYPECTOMY: SHX5525

## 2020-03-25 LAB — HM COLONOSCOPY

## 2020-03-25 SURGERY — COLONOSCOPY WITH PROPOFOL
Anesthesia: General

## 2020-03-25 MED ORDER — LIDOCAINE HCL (PF) 2 % IJ SOLN
INTRAMUSCULAR | Status: AC
  Filled 2020-03-25: qty 5

## 2020-03-25 MED ORDER — LACTATED RINGERS IV SOLN: INTRAVENOUS | Status: DC

## 2020-03-25 MED ORDER — LIDOCAINE HCL (PF) 2 % IJ SOLN
INTRAMUSCULAR | Status: DC | PRN
  Administered 2020-03-25: 50 mg

## 2020-03-25 MED ORDER — PROPOFOL 10 MG/ML IV BOLUS
INTRAVENOUS | Status: DC | PRN
  Administered 2020-03-25: 100 mg via INTRAVENOUS

## 2020-03-25 MED ORDER — PROPOFOL 10 MG/ML IV BOLUS
INTRAVENOUS | Status: AC
  Filled 2020-03-25: qty 40

## 2020-03-25 MED ORDER — SODIUM CHLORIDE FLUSH 0.9 % IV SOLN
INTRAVENOUS | Status: AC
  Filled 2020-03-25: qty 10

## 2020-03-25 MED ORDER — PROPOFOL 500 MG/50ML IV EMUL
INTRAVENOUS | Status: DC | PRN
  Administered 2020-03-25: 100 ug/kg/min via INTRAVENOUS

## 2020-03-25 NOTE — Interval H&P Note (Signed)
History and Physical Interval Note:  03/25/2020 11:29 AM  Caleb Richardson is a 50 y.o. male with PMH HTN, who presents to the hospital for [facility] screening after incomplete colonoscopies.  Patient has been asymptomatic.  Denies having any nausea, vomiting, fever, chills, melena or hematochezia.  Has not had any weight loss.  He denies any family history of colorectal cancer.  Last Colonoscopy:12/13/2018 - performed at [facility] Dr. Jory Sims, incomplete colonoscopy, visualized only until the ascending colon, this was followed by CT virtual colonoscopy on 02/20/2019 which had an incomplete prep but no large lesions were observed.  Caleb Richardson  has presented today for surgery, with the diagnosis of Screening Colonoscopy.  The various methods of treatment have been discussed with the patient and family. After consideration of risks, benefits and other options for treatment, the patient has consented to  Procedure(s) with comments: COLONOSCOPY WITH PROPOFOL (N/A) - 11:30 as a surgical intervention.  The patient's history has been reviewed, patient examined, no change in status, stable for surgery.  I have reviewed the patient's chart and labs.  Questions were answered to the patient's satisfaction.     Caleb Richardson

## 2020-03-25 NOTE — Op Note (Signed)
[facility] Patient Name: Caleb Richardson Procedure Date: 03/25/2020 12:36 PM MRN: [id number] Date of Birth: 19-Apr-1970 Attending MD: Maylon Peppers ,  CSN: [id number] Age: 50 Admit Type: Outpatient Procedure:                Colonoscopy Indications:              Screening for colorectal malignant neoplasm,                            history of incomplete colonoscopies Providers:                Maylon Peppers, Janeece Riggers, RN, Kristine L.                            Risa Grill, Technician Referring MD:              Medicines:                Monitored Anesthesia Care Complications:            No immediate complications. Estimated Blood Loss:     Estimated blood loss: none. Procedure:                Pre-Anesthesia Assessment:                           - Prior to the procedure, a History and Physical                            was performed, and patient medications, allergies                            and sensitivities were reviewed. The patient's                            tolerance of previous anesthesia was reviewed.                           - The risks and benefits of the procedure and the                            sedation options and risks were discussed with the                            patient. All questions were answered and informed                            consent was obtained.                           - ASA Grade Assessment: II - A patient with mild                            systemic disease.                           After obtaining informed consent, the colonoscope  was passed under direct vision. Throughout the                            procedure, the patient's blood pressure, pulse, and                            oxygen saturations were monitored continuously. The                            [id number]-HQ190L ([id number]) scope was introduced through                            the anus and advanced to the the cecum, identified                             by appendiceal orifice and ileocecal valve. The                            colonoscopy was performed without difficulty. The                            patient tolerated the procedure well. The quality                            of the bowel preparation was adequate. Scope                            withdrawal time was 20 minutes. Scope In: 1:25:11 PM Scope Out: 2:10:22 PM Scope Withdrawal Time: 0 hours 37 minutes 0 seconds  Total Procedure Duration: 0 hours 45 minutes 11 seconds  Findings:      The perianal and digital rectal examinations were normal.      A 8 mm polyp was found in the cecum. The polyp was semi-sessile. Area       was successfully injected with 7 mL Eleview for a lift polypectomy. The       polyp was removed with a cold snare. Resection and retrieval were       complete.      A 5 mm polyp was found in the transverse colon. The polyp was       semi-sessile. The polyp was removed with a cold snare. Resection and       retrieval were complete.      A 6 mm polyp was found in the descending colon. The polyp was flat. Area       was successfully injected with 4 mL Eleview for a lift polypectomy. The       polyp was removed with a cold snare. Resection and retrieval were       complete.      A 8 mm polyp was found in the descending colon. The polyp was flat. Area       was successfully injected with 5 mL Eleview for a lift polypectomy. The       polyp was removed with a cold snare. Resection and retrieval were       complete.      A single small angiodysplastic lesion without bleeding was found in the  sigmoid colon.      Non-bleeding internal hemorrhoids were found during retroflexion. The       hemorrhoids were small. Impression:               - One 8 mm polyp in the cecum, removed with a cold                            snare. Resected and retrieved. Injected.                           - One 5 mm polyp in the transverse colon, removed                             with a cold snare. Resected and retrieved.                           - One 6 mm polyp in the descending colon, removed                            with a cold snare. Resected and retrieved. Injected.                           - One 8 mm polyp in the descending colon, removed                            with a cold snare. Resected and retrieved. Injected.                           - A single non-bleeding colonic angiodysplastic                            lesion.                           - Non-bleeding internal hemorrhoids. Moderate Sedation:      Per Anesthesia Care Recommendation:           - Discharge patient to home (ambulatory).                           - Resume previous diet.                           - Await pathology results.                           - Repeat colonoscopy for surveillance based on                            pathology results. Procedure Code(s):        --- Professional ---                           [phone], Colonoscopy, flexible; with removal of  tumor(s), polyp(s), or other lesion(s) by snare                            technique                           45381, Colonoscopy, flexible; with directed                            submucosal injection(s), any substance Diagnosis Code(s):        --- Professional ---                           Z12.11, Encounter for screening for malignant                            neoplasm of colon                           K63.5, Polyp of colon                           K55.20, Angiodysplasia of colon without hemorrhage                           K64.8, Other hemorrhoids CPT copyright 2019 American Medical Association. All rights reserved. The codes documented in this report are preliminary and upon coder review may  be revised to meet current compliance requirements. Maylon Peppers, MD Maylon Peppers,  03/25/2020 2:21:48 PM This report has been signed electronically. Number of Addenda: 0

## 2020-03-25 NOTE — Transfer of Care (Signed)
Immediate Anesthesia Transfer of Care Note  Patient: Caleb Richardson  Procedure(s) Performed: COLONOSCOPY WITH PROPOFOL (N/A ) POLYPECTOMY  Patient Location: PACU  Anesthesia Type:General  Level of Consciousness: awake, alert  and oriented  Airway & Oxygen Therapy: Patient Spontanous Breathing  Post-op Assessment: Report given to RN and Post -op Vital signs reviewed and stable  Post vital signs: Reviewed and stable  Last Vitals:  Vitals Value Taken Time  BP    Temp    Pulse    Resp    SpO2      Last Pain:  Vitals:   03/25/20 1029  TempSrc: Oral  PainSc:       Patients Stated Pain Goal: 7 (57/01/77 9390)  Complications: No complications documented.

## 2020-03-25 NOTE — Anesthesia Preprocedure Evaluation (Signed)
Anesthesia Evaluation  Patient identified by MRN, date of birth, ID band Patient awake    Reviewed: Allergy & Precautions, H&P , NPO status , Patient's Chart, lab work & pertinent test results, reviewed documented beta blocker date and time   Airway Mallampati: II  TM Distance: >3 FB Neck ROM: full    Dental no notable dental hx. (+) Teeth Intact   Pulmonary neg pulmonary ROS, Current Smoker,    Pulmonary exam normal breath sounds clear to auscultation       Cardiovascular Exercise Tolerance: Good hypertension, negative cardio ROS   Rhythm:regular Rate:Normal     Neuro/Psych negative neurological ROS  negative psych ROS   GI/Hepatic negative GI ROS, Neg liver ROS,   Endo/Other  negative endocrine ROS  Renal/GU negative Renal ROS  negative genitourinary   Musculoskeletal   Abdominal   Peds  Hematology negative hematology ROS (+)   Anesthesia Other Findings   Reproductive/Obstetrics negative OB ROS                             Anesthesia Physical Anesthesia Plan  ASA: II  Anesthesia Plan: General   Post-op Pain Management:    Induction:   PONV Risk Score and Plan: Propofol infusion  Airway Management Planned:   Additional Equipment:   Intra-op Plan:   Post-operative Plan:   Informed Consent: I have reviewed the patients History and Physical, chart, labs and discussed the procedure including the risks, benefits and alternatives for the proposed anesthesia with the patient or authorized representative who has indicated his/her understanding and acceptance.     Dental Advisory Given  Plan Discussed with: CRNA  Anesthesia Plan Comments:         Anesthesia Quick Evaluation

## 2020-03-25 NOTE — Discharge Instructions (Signed)
You are being discharged to home.  Resume your previous diet.  We are waiting for your pathology results.  Your physician has recommended a repeat colonoscopy for surveillance based on pathology results.    Colon Polyps  Colon polyps are tissue growths inside the colon, which is part of the large intestine. They are one of the types of polyps that can grow in the body. A polyp may be a round bump or a mushroom-shaped growth. You could have one polyp or more than one. Most colon polyps are noncancerous (benign). However, some colon polyps can become cancerous over time. Finding and removing the polyps early can help prevent this. What are the causes? The exact cause of colon polyps is not known. What increases the risk? The following factors may make you more likely to develop this condition:  Having a family history of colorectal cancer or colon polyps.  Being older than 52 years of age.  Being younger than 52 years of age and having a significant family history of colorectal cancer or colon polyps or a genetic condition that puts you at higher risk of getting colon polyps.  Having inflammatory bowel disease, such as ulcerative colitis or Crohn's disease.  Having certain conditions passed from parent to child (hereditary conditions), such as: ? Familial adenomatous polyposis (FAP). ? Lynch syndrome. ? Turcot syndrome. ? Peutz-Jeghers syndrome. ? MUTYH-associated polyposis (MAP).  Being overweight.  Certain lifestyle factors. These include smoking cigarettes, drinking too much alcohol, not getting enough exercise, and eating a diet that is high in fat and red meat and low in fiber.  Having had childhood cancer that was treated with radiation of the abdomen. What are the signs or symptoms? Many times, there are no symptoms. If you have symptoms, they may include:  Blood coming from the rectum during a bowel movement.  Blood in the stool (feces). The blood may be bright red or  very dark in color.  Pain in the abdomen.  A change in bowel habits, such as constipation or diarrhea. How is this diagnosed? This condition is diagnosed with a colonoscopy. This is a procedure in which a lighted, flexible scope is inserted into the opening between the buttocks (anus) and then passed into the colon to examine the area. Polyps are sometimes found when a colonoscopy is done as part of routine cancer screening tests. How is this treated? This condition is treated by removing any polyps that are found. Most polyps can be removed during a colonoscopy. Those polyps will then be tested for cancer. Additional treatment may be needed depending on the results of testing. Follow these instructions at home: Eating and drinking  Eat foods that are high in fiber, such as fruits, vegetables, and whole grains.  Eat foods that are high in calcium and vitamin D, such as milk, cheese, yogurt, eggs, liver, fish, and broccoli.  Limit foods that are high in fat, such as fried foods and desserts.  Limit the amount of red meat, precooked or cured meat, or other processed meat that you eat, such as hot dogs, sausages, bacon, or meat loaves.  Limit sugary drinks.   Lifestyle  Maintain a healthy weight, or lose weight if recommended by your health care provider.  Exercise every day or as told by your health care provider.  Do not use any products that contain nicotine or tobacco, such as cigarettes, e-cigarettes, and chewing tobacco. If you need help quitting, ask your health care provider.  Do not drink alcohol if: ?   Your health care provider tells you not to drink. ? You are pregnant, may be pregnant, or are planning to become pregnant.  If you drink alcohol: ? Limit how much you use to:  0-1 drink a day for women.  0-2 drinks a day for men. ? Know how much alcohol is in your drink. In the [place] one drink equals one 12 oz bottle of beer (355 mL), one 5 oz glass of wine (148 mL), or one  1 oz glass of hard liquor (44 mL). General instructions  Take over-the-counter and prescription medicines only as told by your health care provider.  Keep all follow-up visits. This is important. This includes having regularly scheduled colonoscopies. Talk to your health care provider about when you need a colonoscopy. Contact a health care provider if:  You have new or worsening bleeding during a bowel movement.  You have new or increased blood in your stool.  You have a change in bowel habits.  You lose weight for no known reason. Summary  Colon polyps are tissue growths inside the colon, which is part of the large intestine. They are one type of polyp that can grow in the body.  Most colon polyps are noncancerous (benign), but some can become cancerous over time.  This condition is diagnosed with a colonoscopy.  This condition is treated by removing any polyps that are found. Most polyps can be removed during a colonoscopy. This information is not intended to replace advice given to you by your health care provider. Make sure you discuss any questions you have with your health care provider. Document Revised: 05/09/2019 Document Reviewed: 05/09/2019 Elsevier Patient Education  2021 Elsevier Inc.  Colonoscopy, Adult, Care After This sheet gives you information about how to care for yourself after your procedure. Your doctor may also give you more specific instructions. If you have problems or questions, call your doctor. What can I expect after the procedure? After the procedure, it is common to have:  A small amount of blood in your poop (stool) for 24 hours.  Some gas.  Mild cramping or bloating in your belly (abdomen). Follow these instructions at home: Eating and drinking  Drink enough fluid to keep your pee (urine) pale yellow.  Follow instructions from your doctor about what you cannot eat or drink.  Return to your normal diet as told by your doctor. Avoid heavy  or fried foods that are hard to digest.   Activity  Rest as told by your doctor.  Do not sit for a long time without moving. Get up to take short walks every 1-2 hours. This is important. Ask for help if you feel weak or unsteady.  Return to your normal activities as told by your doctor. Ask your doctor what activities are safe for you. To help cramping and bloating:  Try walking around.  Put heat on your belly as told by your doctor. Use the heat source that your doctor recommends, such as a moist heat pack or a heating pad. ? Put a towel between your skin and the heat source. ? Leave the heat on for 20-30 minutes. ? Remove the heat if your skin turns bright red. This is very important if you are unable to feel pain, heat, or cold. You may have a greater risk of getting burned.   General instructions  If you were given a medicine to help you relax (sedative) during your procedure, it can affect you for many hours. Do not drive or   use machinery until your doctor says that it is safe.  For the first 24 hours after the procedure: ? Do not sign important documents. ? Do not drink alcohol. ? Do your daily activities more slowly than normal. ? Eat foods that are soft and easy to digest.  Take over-the-counter or prescription medicines only as told by your doctor.  Keep all follow-up visits as told by your doctor. This is important. Contact a doctor if:  You have blood in your poop 2-3 days after the procedure. Get help right away if:  You have more than a small amount of blood in your poop.  You see large clumps of tissue (blood clots) in your poop.  Your belly is swollen.  You feel like you may vomit (nauseous).  You vomit.  You have a fever.  You have belly pain that gets worse, and medicine does not help your pain. Summary  After the procedure, it is common to have a small amount of blood in your poop. You may also have mild cramping and bloating in your belly.  If  you were given a medicine to help you relax (sedative) during your procedure, it can affect you for many hours. Do not drive or use machinery until your doctor says that it is safe.  Get help right away if you have a lot of blood in your poop, feel like you may vomit, have a fever, or have more belly pain. This information is not intended to replace advice given to you by your health care provider. Make sure you discuss any questions you have with your health care provider. Document Revised: 11/24/2018 Document Reviewed: 08/14/2018 Elsevier Patient Education  2021 Elsevier Inc.  

## 2020-03-25 NOTE — Anesthesia Postprocedure Evaluation (Signed)
Anesthesia Post Note  Patient: Caleb Richardson  Procedure(s) Performed: COLONOSCOPY WITH PROPOFOL (N/A ) POLYPECTOMY  Patient location during evaluation: Phase II Anesthesia Type: General Level of consciousness: awake and alert and oriented Pain management: satisfactory to patient Vital Signs Assessment: post-procedure vital signs reviewed and stable Respiratory status: spontaneous breathing and respiratory function stable Cardiovascular status: blood pressure returned to baseline and stable Postop Assessment: no apparent nausea or vomiting Anesthetic complications: no   No complications documented.   Last Vitals:  Vitals:   03/25/20 1028 03/25/20 1029  BP:  (!) 145/92  Pulse:  (!) 106  Resp:  19  Temp: 36.8 C   SpO2:  99%    Last Pain:  Vitals:   03/25/20 1029  TempSrc: Oral  PainSc:                  Karna Dupes

## 2020-03-27 ENCOUNTER — Telehealth (INDEPENDENT_AMBULATORY_CARE_PROVIDER_SITE_OTHER): Payer: Self-pay | Admitting: Gastroenterology

## 2020-03-27 LAB — SURGICAL PATHOLOGY

## 2020-03-27 NOTE — Telephone Encounter (Signed)
Spoke to the patient today regarding the results of his last colonoscopy. I explained that the recommendations for 3 SSLs are to repeat a colonoscopy in 3 years, he understood and agreed.

## 2020-03-27 NOTE — Telephone Encounter (Signed)
Patient had a procedure on 03/25/2020 - Patient called the office wanted to know why he has to come back in three years - please advise - ph# [phone]

## 2020-03-31 ENCOUNTER — Encounter (HOSPITAL_COMMUNITY): Payer: Self-pay | Admitting: Gastroenterology

## 2020-04-01 ENCOUNTER — Encounter (INDEPENDENT_AMBULATORY_CARE_PROVIDER_SITE_OTHER): Payer: Self-pay | Admitting: *Deleted

## 2020-04-15 DIAGNOSIS — Z833 Family history of diabetes mellitus: Secondary | ICD-10-CM | POA: Diagnosis not present

## 2020-04-15 DIAGNOSIS — E669 Obesity, unspecified: Secondary | ICD-10-CM | POA: Diagnosis not present

## 2020-04-15 DIAGNOSIS — Z720 Tobacco use: Secondary | ICD-10-CM | POA: Diagnosis not present

## 2020-04-15 DIAGNOSIS — Z6831 Body mass index (BMI) 31.0-31.9, adult: Secondary | ICD-10-CM | POA: Diagnosis not present

## 2020-04-15 DIAGNOSIS — I10 Essential (primary) hypertension: Secondary | ICD-10-CM | POA: Diagnosis not present

## 2020-04-22 DIAGNOSIS — I10 Essential (primary) hypertension: Secondary | ICD-10-CM | POA: Diagnosis not present

## 2020-06-10 IMAGING — CT CT VIRTUAL COLONOSCOPY SCREENING
2 of 9 series · 12 of 46 positions shown, 18 images · non-contrast
Comparison: None.

CLINICAL DATA: Colon cancer screening

EXAM:
CT VIRTUAL COLONOSCOPY SCREENING
TECHNIQUE: The patient was given a standard Mag citrate bowel preparation with
Gastrografin and barium for fluid and stool tagging respectively.
The quality of the bowel preparation is poor. Automated CO2
insufflation of the colon was performed prior to image acquisition
and colonic distention is moderate. Image post processing was used
to generate a 3D endoluminal fly-through projection of the colon and
to electronically subtract stool/fluid as appropriate.

[Series 6: supine colon 3.00 br40 s3 cor supine · coronal · 0.84mm/px · 3 of 122 slices shown]
[im 31/122  soft-tissue]
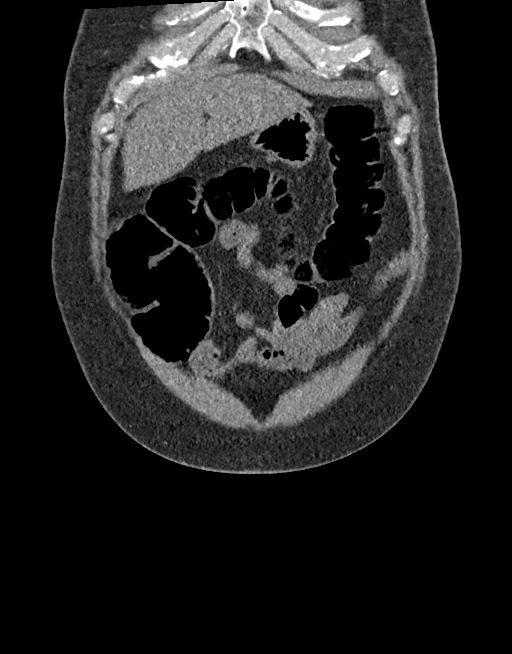
[im 61/122  soft-tissue]
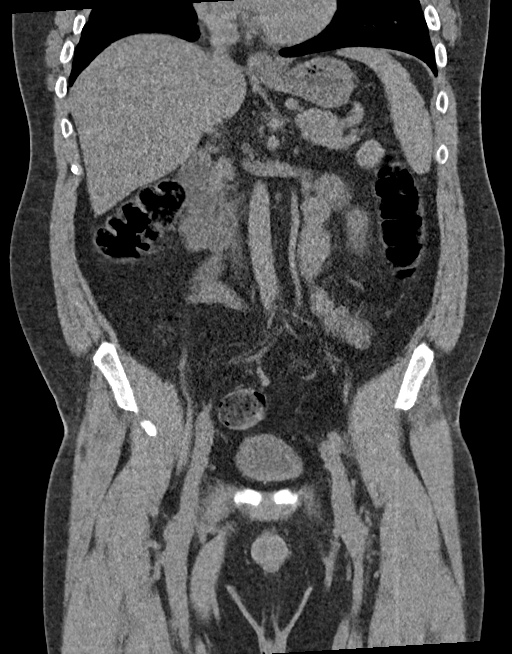
[im 91/122  soft-tissue]
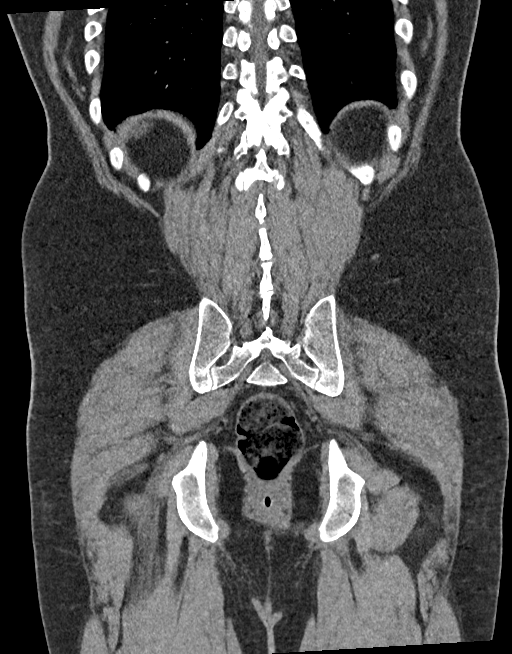

[Series 11: prone colon 1.50 br40 s3 prone thin · axial · 0.98mm/px · z∈[+1252,+1705]mm · 9 of 378 slices shown, 15 images]
[im 38/378  soft-tissue]
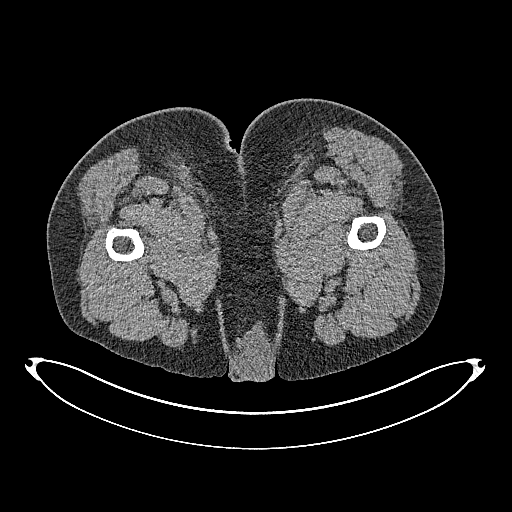
[im 38/378  bone]
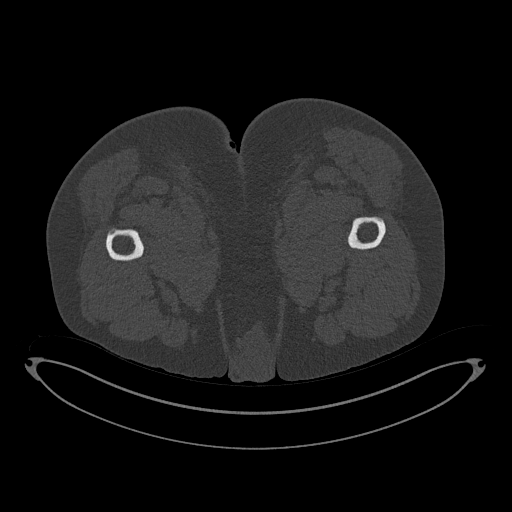
[im 76/378  soft-tissue]
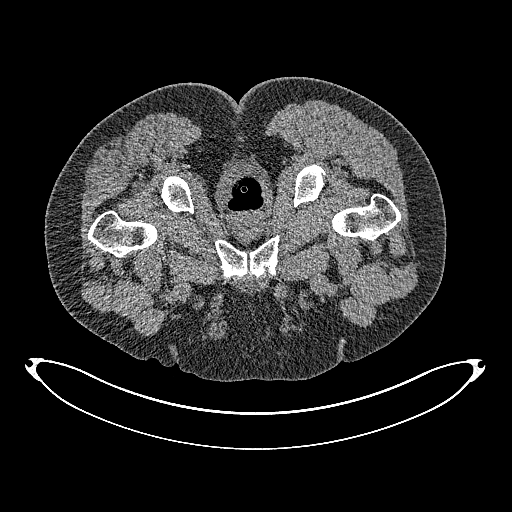
[im 114/378  soft-tissue]
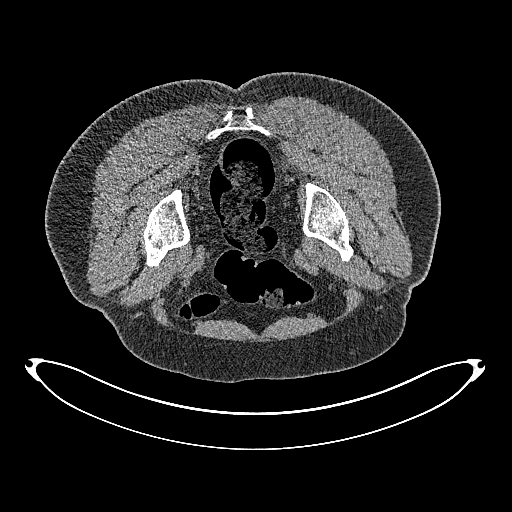
[im 151/378  soft-tissue]
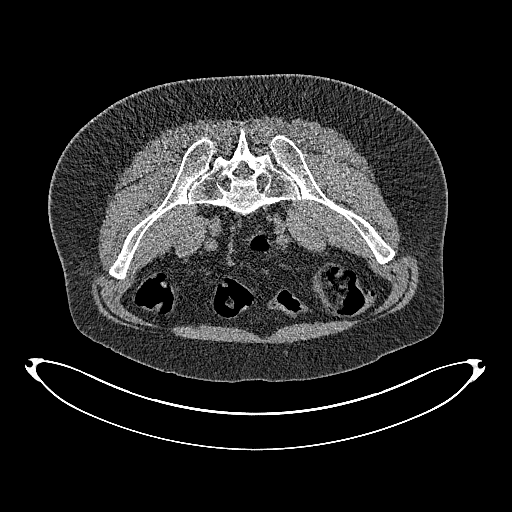
[im 189/378  soft-tissue]
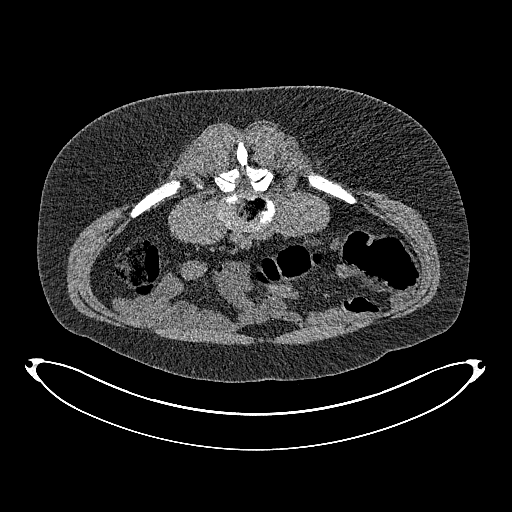
[im 227/378  soft-tissue]
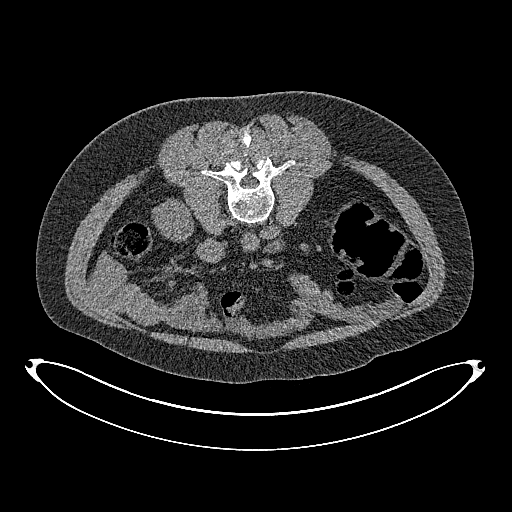
[im 227/378  lung]
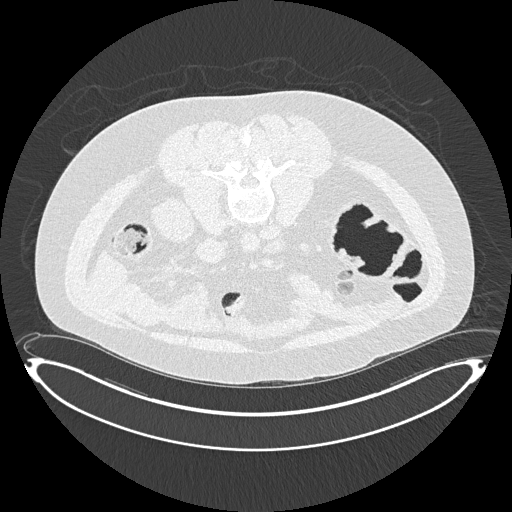
[im 264/378  soft-tissue]
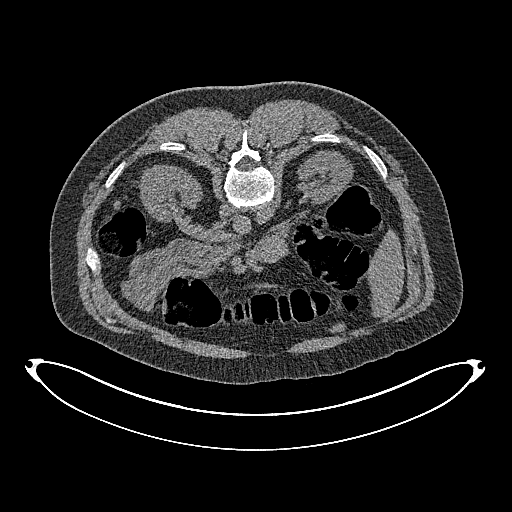
[im 264/378  lung]
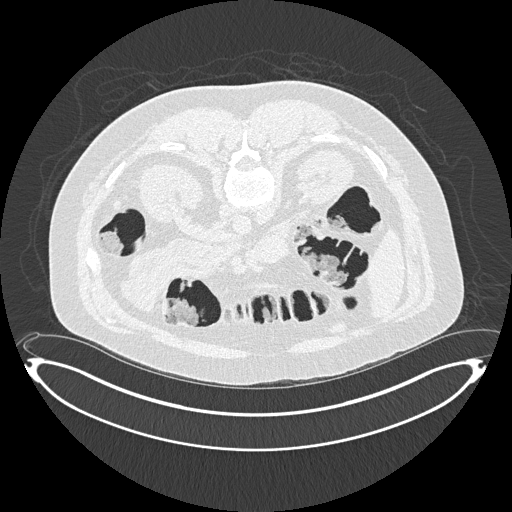
[im 302/378  soft-tissue]
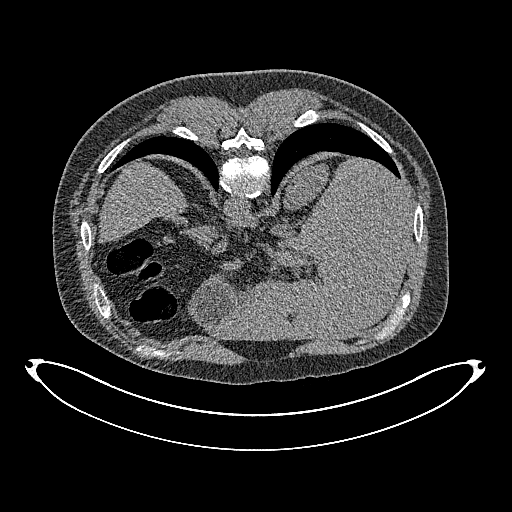
[im 302/378  lung]
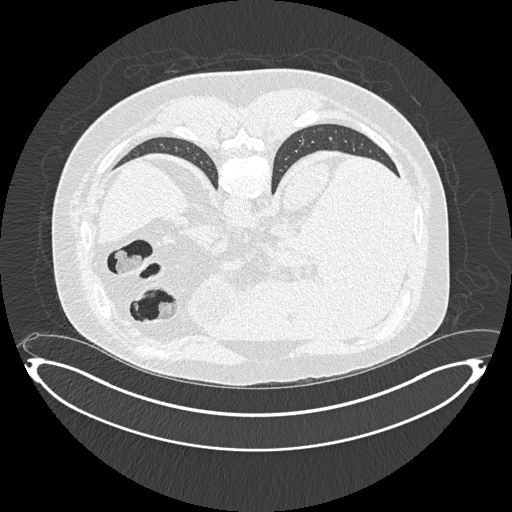
[im 340/378  soft-tissue]
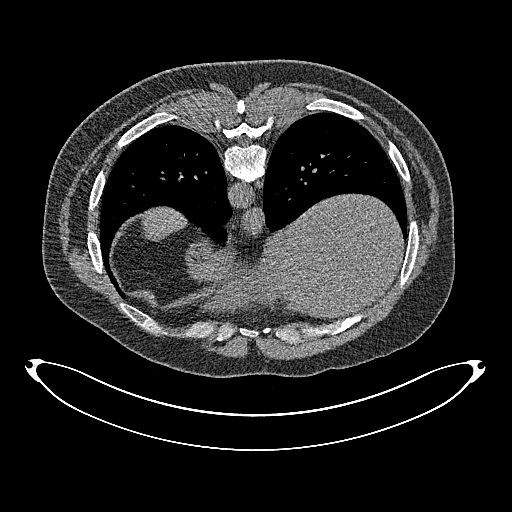
[im 340/378  lung]
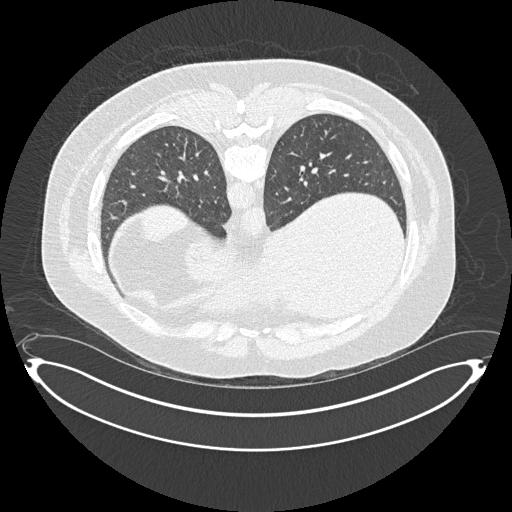
[im 340/378  bone]
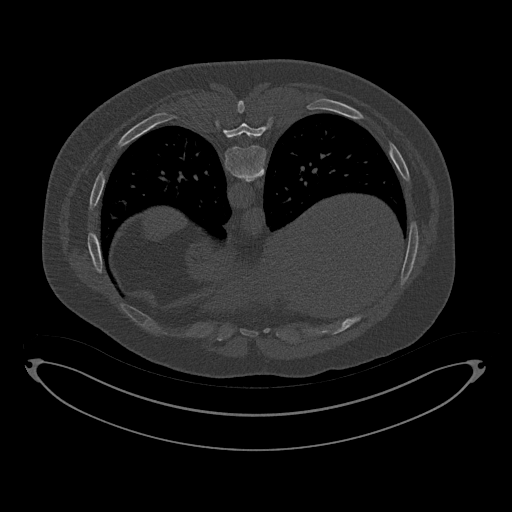

[12 of 46 positions shown; findings below may reference images not displayed]

FINDINGS: VIRTUAL COLONOSCOPY

Moderate stool/debris throughout the colon, indicating incomplete
prep. This markedly constraints endoluminal 3D evaluation.

On 2D CT evaluation, there is no significant colonic polyp, mass,
apple core lesion, or stricture.

No evidence of bowel obstruction. Normal appendix (series 5/image
87).

Virtual colonoscopy is not designed to detect diminutive polyps
(i.e., less than or equal to 5 mm), the presence or absence of which
may not affect clinical management.

CT ABDOMEN AND PELVIS WITHOUT CONTRAST

Lower chest: Lung bases are clear.

Hepatobiliary: Unenhanced liver is unremarkable.

Gallbladder is unremarkable. No intrahepatic or extrahepatic ductal
dilatation.

Pancreas: Within normal limits.

Spleen: Within normal limits.

Adrenals/Urinary Tract: Adrenal glands are within normal limits.

Kidneys are within normal limits. No renal, ureteral, or bladder
calculi. No hydronephrosis.

Thick-walled bladder, although underdistended.

Stomach/Bowel: Stomach is unremarkable.

Visualized bowel is described above.

Vascular/Lymphatic: No evidence of abdominal aortic aneurysm.

No suspicious abdominopelvic lymphadenopathy.

Reproductive: Prostate is unremarkable.

Other: No abdominopelvic ascites.

Musculoskeletal: Degenerative changes of the visualized
thoracolumbar spine.
IMPRESSION: Limited evaluation due to incomplete prep.

No significant colonic polyp, mass, apple core lesion, or stricture.

## 2020-07-24 DIAGNOSIS — E7849 Other hyperlipidemia: Secondary | ICD-10-CM | POA: Diagnosis not present

## 2020-07-24 DIAGNOSIS — I10 Essential (primary) hypertension: Secondary | ICD-10-CM | POA: Diagnosis not present

## 2020-07-24 DIAGNOSIS — Z6832 Body mass index (BMI) 32.0-32.9, adult: Secondary | ICD-10-CM | POA: Diagnosis not present

## 2020-10-29 DIAGNOSIS — Z6834 Body mass index (BMI) 34.0-34.9, adult: Secondary | ICD-10-CM | POA: Diagnosis not present

## 2020-10-29 DIAGNOSIS — Z0000 Encounter for general adult medical examination without abnormal findings: Secondary | ICD-10-CM | POA: Diagnosis not present

## 2020-10-29 DIAGNOSIS — E7849 Other hyperlipidemia: Secondary | ICD-10-CM | POA: Diagnosis not present

## 2020-10-29 DIAGNOSIS — I10 Essential (primary) hypertension: Secondary | ICD-10-CM | POA: Diagnosis not present

## 2021-03-10 DIAGNOSIS — E7849 Other hyperlipidemia: Secondary | ICD-10-CM | POA: Diagnosis not present

## 2021-03-10 DIAGNOSIS — Z6832 Body mass index (BMI) 32.0-32.9, adult: Secondary | ICD-10-CM | POA: Diagnosis not present

## 2021-03-10 DIAGNOSIS — Z0000 Encounter for general adult medical examination without abnormal findings: Secondary | ICD-10-CM | POA: Diagnosis not present

## 2021-03-10 DIAGNOSIS — I10 Essential (primary) hypertension: Secondary | ICD-10-CM | POA: Diagnosis not present

## 2021-06-04 DIAGNOSIS — M5459 Other low back pain: Secondary | ICD-10-CM | POA: Diagnosis not present

## 2021-06-04 DIAGNOSIS — Z6832 Body mass index (BMI) 32.0-32.9, adult: Secondary | ICD-10-CM | POA: Diagnosis not present

## 2021-06-18 DIAGNOSIS — I10 Essential (primary) hypertension: Secondary | ICD-10-CM | POA: Diagnosis not present

## 2021-06-18 DIAGNOSIS — Z6831 Body mass index (BMI) 31.0-31.9, adult: Secondary | ICD-10-CM | POA: Diagnosis not present

## 2021-06-18 DIAGNOSIS — E7849 Other hyperlipidemia: Secondary | ICD-10-CM | POA: Diagnosis not present

## 2021-06-18 DIAGNOSIS — M5459 Other low back pain: Secondary | ICD-10-CM | POA: Diagnosis not present

## 2021-09-14 DIAGNOSIS — M5459 Other low back pain: Secondary | ICD-10-CM | POA: Diagnosis not present

## 2021-09-14 DIAGNOSIS — Z6831 Body mass index (BMI) 31.0-31.9, adult: Secondary | ICD-10-CM | POA: Diagnosis not present

## 2021-09-14 DIAGNOSIS — E7849 Other hyperlipidemia: Secondary | ICD-10-CM | POA: Diagnosis not present

## 2021-09-14 DIAGNOSIS — I10 Essential (primary) hypertension: Secondary | ICD-10-CM | POA: Diagnosis not present

## 2021-12-14 DIAGNOSIS — S83422A Sprain of lateral collateral ligament of left knee, initial encounter: Secondary | ICD-10-CM | POA: Diagnosis not present

## 2021-12-14 DIAGNOSIS — S83422D Sprain of lateral collateral ligament of left knee, subsequent encounter: Secondary | ICD-10-CM | POA: Diagnosis not present

## 2021-12-14 DIAGNOSIS — Z6831 Body mass index (BMI) 31.0-31.9, adult: Secondary | ICD-10-CM | POA: Diagnosis not present

## 2021-12-14 DIAGNOSIS — E7849 Other hyperlipidemia: Secondary | ICD-10-CM | POA: Diagnosis not present

## 2021-12-14 DIAGNOSIS — I10 Essential (primary) hypertension: Secondary | ICD-10-CM | POA: Diagnosis not present

## 2021-12-14 DIAGNOSIS — Z0000 Encounter for general adult medical examination without abnormal findings: Secondary | ICD-10-CM | POA: Diagnosis not present

## 2021-12-14 DIAGNOSIS — M5459 Other low back pain: Secondary | ICD-10-CM | POA: Diagnosis not present

## 2022-03-29 DIAGNOSIS — I10 Essential (primary) hypertension: Secondary | ICD-10-CM | POA: Diagnosis not present

## 2022-03-29 DIAGNOSIS — Z6831 Body mass index (BMI) 31.0-31.9, adult: Secondary | ICD-10-CM | POA: Diagnosis not present

## 2022-03-29 DIAGNOSIS — E7849 Other hyperlipidemia: Secondary | ICD-10-CM | POA: Diagnosis not present

## 2022-03-29 DIAGNOSIS — M25562 Pain in left knee: Secondary | ICD-10-CM | POA: Diagnosis not present

## 2022-08-24 DIAGNOSIS — E7849 Other hyperlipidemia: Secondary | ICD-10-CM | POA: Diagnosis not present

## 2022-08-24 DIAGNOSIS — Z6829 Body mass index (BMI) 29.0-29.9, adult: Secondary | ICD-10-CM | POA: Diagnosis not present

## 2022-08-24 DIAGNOSIS — M25561 Pain in right knee: Secondary | ICD-10-CM | POA: Diagnosis not present

## 2022-08-24 DIAGNOSIS — N182 Chronic kidney disease, stage 2 (mild): Secondary | ICD-10-CM | POA: Diagnosis not present

## 2022-08-24 DIAGNOSIS — I10 Essential (primary) hypertension: Secondary | ICD-10-CM | POA: Diagnosis not present

## 2022-08-24 DIAGNOSIS — Z79899 Other long term (current) drug therapy: Secondary | ICD-10-CM | POA: Diagnosis not present

## 2022-08-24 DIAGNOSIS — Z0000 Encounter for general adult medical examination without abnormal findings: Secondary | ICD-10-CM | POA: Diagnosis not present

## 2022-08-24 DIAGNOSIS — M25562 Pain in left knee: Secondary | ICD-10-CM | POA: Diagnosis not present

## 2022-08-24 DIAGNOSIS — Z125 Encounter for screening for malignant neoplasm of prostate: Secondary | ICD-10-CM | POA: Diagnosis not present

## 2022-11-24 DIAGNOSIS — I10 Essential (primary) hypertension: Secondary | ICD-10-CM | POA: Diagnosis not present

## 2022-11-24 DIAGNOSIS — M25561 Pain in right knee: Secondary | ICD-10-CM | POA: Diagnosis not present

## 2022-11-24 DIAGNOSIS — N182 Chronic kidney disease, stage 2 (mild): Secondary | ICD-10-CM | POA: Diagnosis not present

## 2022-11-24 DIAGNOSIS — E7849 Other hyperlipidemia: Secondary | ICD-10-CM | POA: Diagnosis not present

## 2022-11-24 DIAGNOSIS — Z6829 Body mass index (BMI) 29.0-29.9, adult: Secondary | ICD-10-CM | POA: Diagnosis not present

## 2023-02-03 DIAGNOSIS — I700 Atherosclerosis of aorta: Secondary | ICD-10-CM | POA: Diagnosis not present

## 2023-02-03 DIAGNOSIS — K449 Diaphragmatic hernia without obstruction or gangrene: Secondary | ICD-10-CM | POA: Diagnosis not present

## 2023-02-03 DIAGNOSIS — R109 Unspecified abdominal pain: Secondary | ICD-10-CM | POA: Diagnosis not present

## 2023-02-03 DIAGNOSIS — M545 Low back pain, unspecified: Secondary | ICD-10-CM | POA: Diagnosis not present

## 2023-02-03 DIAGNOSIS — I10 Essential (primary) hypertension: Secondary | ICD-10-CM | POA: Diagnosis not present

## 2023-02-03 DIAGNOSIS — K5900 Constipation, unspecified: Secondary | ICD-10-CM | POA: Diagnosis not present

## 2023-02-04 ENCOUNTER — Encounter (INDEPENDENT_AMBULATORY_CARE_PROVIDER_SITE_OTHER): Payer: Self-pay | Admitting: *Deleted

## 2023-03-02 DIAGNOSIS — E7849 Other hyperlipidemia: Secondary | ICD-10-CM | POA: Diagnosis not present

## 2023-03-02 DIAGNOSIS — I10 Essential (primary) hypertension: Secondary | ICD-10-CM | POA: Diagnosis not present

## 2023-03-02 DIAGNOSIS — Z6831 Body mass index (BMI) 31.0-31.9, adult: Secondary | ICD-10-CM | POA: Diagnosis not present

## 2023-03-02 DIAGNOSIS — K5900 Constipation, unspecified: Secondary | ICD-10-CM | POA: Diagnosis not present

## 2023-03-02 DIAGNOSIS — N182 Chronic kidney disease, stage 2 (mild): Secondary | ICD-10-CM | POA: Diagnosis not present

## 2023-03-02 DIAGNOSIS — M25561 Pain in right knee: Secondary | ICD-10-CM | POA: Diagnosis not present

## 2023-03-02 DIAGNOSIS — I700 Atherosclerosis of aorta: Secondary | ICD-10-CM | POA: Diagnosis not present

## 2023-03-21 ENCOUNTER — Telehealth (INDEPENDENT_AMBULATORY_CARE_PROVIDER_SITE_OTHER): Payer: Self-pay | Admitting: Gastroenterology

## 2023-03-21 DIAGNOSIS — Z860100 Personal history of colon polyps, unspecified: Secondary | ICD-10-CM

## 2023-03-21 NOTE — Telephone Encounter (Signed)
 Who is your primary care physician: Dr.Hasanaj  Reasons for the colonoscopy:   Have you had a colonoscopy before?  Yes about 3 years ago  Do you have family history of colon cancer? no  Previous colonoscopy with polyps removed? yes  Do you have a history colorectal cancer?   no  Are you diabetic? If yes, Type 1 or Type 2?    no  Do you have a prosthetic or mechanical heart valve? no  Do you have a pacemaker/defibrillator?   no  Have you had endocarditis/atrial fibrillation? no  Have you had joint replacement within the last 12 months?  no  Do you tend to be constipated or have to use laxatives? no  Do you have any history of drugs or alchohol?  no  Do you use supplemental oxygen?  no  Have you had a stroke or heart attack within the last 6 months? no  Do you take weight loss medication?  no      Do you take any blood-thinning medications such as: (aspirin, warfarin, Plavix, Aggrenox)    If yes we need the name, milligram, dosage and who is prescribing doctor  Current Outpatient Medications on File Prior to Visit  Medication Sig Dispense Refill   amLODipine (NORVASC) 10 MG tablet Take 10 mg by mouth daily.     bisoprolol-hydrochlorothiazide (ZIAC) 10-6.25 MG tablet Take 1 tablet by mouth daily.     No current facility-administered medications on file prior to visit.    Allergies  Allergen Reactions   Avocado Anaphylaxis   Bee Pollen Photosensitivity    Watery eyes   Dust Mite Extract    Mixed Ragweed Photosensitivity     Pharmacy: [facility]  Primary Insurance Name: Caleb Richardson  Best number where you can be reached: [phone]

## 2023-03-30 NOTE — Telephone Encounter (Signed)
 Will call with April schedule

## 2023-04-14 NOTE — Telephone Encounter (Signed)
 Left message to return call

## 2023-04-20 MED ORDER — PEG 3350-KCL-NA BICARB-NACL 420 G PO SOLR: 4000.0000 mL | Freq: Once | ORAL | 0 refills | Status: AC

## 2023-04-20 NOTE — Telephone Encounter (Signed)
 Pt contacted and scheduled. Pt states he is coming up this way in about a hour and will stop by to get instructions. Need to verify insurance; [place] states [employer] expired on 04/01/23. Prep sent to pharmacy.

## 2023-04-20 NOTE — Addendum Note (Signed)
 Addended by: Marlowe Shores on: 04/20/2023 10:05 AM   Modules accepted: Orders

## 2023-04-21 NOTE — Telephone Encounter (Signed)
 Pt now has [employer] insurance.

## 2023-05-18 ENCOUNTER — Ambulatory Visit (HOSPITAL_COMMUNITY): Admission: RE | Admit: 2023-05-18 | Source: Ambulatory Visit | Admitting: Gastroenterology

## 2023-05-18 ENCOUNTER — Telehealth (INDEPENDENT_AMBULATORY_CARE_PROVIDER_SITE_OTHER): Payer: Self-pay | Admitting: Gastroenterology

## 2023-05-18 ENCOUNTER — Encounter (HOSPITAL_COMMUNITY): Admission: RE | Payer: Self-pay | Source: Ambulatory Visit

## 2023-05-18 SURGERY — COLONOSCOPY
Anesthesia: Choice

## 2023-05-18 NOTE — Telephone Encounter (Signed)
 Message from Anitra stating: Caleb Richardson 01/21/1971 [id number] did not receive instructions and tried to call office several times . no show for surgery   Pt also left 2 voicemail's to reschedule TCS. (I was out of the office yesterday). Returned call to pt but had to leave message to return call

## 2023-05-18 NOTE — Telephone Encounter (Signed)
Pt left message returning call  Returned call to pt but had to leave message to return call

## 2023-05-19 MED ORDER — PEG 3350-KCL-NA BICARB-NACL 420 G PO SOLR: 4000.0000 mL | Freq: Once | ORAL | 0 refills | Status: AC

## 2023-05-19 NOTE — Addendum Note (Signed)
 Addended by: Markella Dao on: 05/19/2023 03:46 PM   Modules accepted: Orders

## 2023-05-19 NOTE — Telephone Encounter (Signed)
 Pt left message returning call twice. Called pt back and rescheduled him. Instructions will be mailed to pt. Prep sent to pharmacy

## 2023-06-02 ENCOUNTER — Encounter (HOSPITAL_COMMUNITY)
Admission: RE | Admit: 2023-06-02 | Discharge: 2023-06-02 | Disposition: A | Discharge status: Home / Self Care | Source: Ambulatory Visit | Attending: Gastroenterology | Admitting: Gastroenterology

## 2023-06-02 DIAGNOSIS — I10 Essential (primary) hypertension: Secondary | ICD-10-CM

## 2023-06-02 DIAGNOSIS — Z79899 Other long term (current) drug therapy: Secondary | ICD-10-CM

## 2023-06-06 ENCOUNTER — Telehealth (INDEPENDENT_AMBULATORY_CARE_PROVIDER_SITE_OTHER): Payer: Self-pay | Admitting: Gastroenterology

## 2023-06-06 NOTE — Telephone Encounter (Signed)
 TCS ASA 1

## 2023-06-06 NOTE — Pre-Procedure Instructions (Signed)
 Patient calls back and needs to "reschedule" his appointment. Office and [employer], [place] scheduler notified.

## 2023-06-06 NOTE — Pre-Procedure Instructions (Signed)
 Attempted pre-op phone call. Phone states that, "call cannot be completed at this time." Office notified.

## 2023-06-06 NOTE — Telephone Encounter (Signed)
 Venancio Gibney, RN  Rozann Cornell, LPN; [place], Kandee Orion, CMA; Alvester Johnson, LPN; Dorotha Gasser Good morning! I just got off the phone with Caleb Richardson and he states he needs to reschedule his procedure for tomorrow. We attempted to call him on Friday and he did not return our call.

## 2023-06-07 ENCOUNTER — Encounter (HOSPITAL_COMMUNITY): Admission: RE | Payer: Self-pay | Source: Home / Self Care

## 2023-06-07 ENCOUNTER — Ambulatory Visit (HOSPITAL_COMMUNITY): Admission: RE | Admit: 2023-06-07 | Source: Home / Self Care | Admitting: Gastroenterology

## 2023-06-07 SURGERY — COLONOSCOPY
Anesthesia: Choice

## 2023-06-08 NOTE — Telephone Encounter (Signed)
 Pt returned call. Pt rescheduled for 07/06/23 at 7:30am. Instructions will be mailed to pt. Pt did not drink prep.

## 2023-06-08 NOTE — Telephone Encounter (Signed)
 Left message for pt letting him that we do not have any availability for May with Dr.Castaneda and we would be in touch to get him rescheduled for June

## 2023-06-29 DIAGNOSIS — K5900 Constipation, unspecified: Secondary | ICD-10-CM | POA: Diagnosis not present

## 2023-06-29 DIAGNOSIS — Z6831 Body mass index (BMI) 31.0-31.9, adult: Secondary | ICD-10-CM | POA: Diagnosis not present

## 2023-06-29 DIAGNOSIS — E7849 Other hyperlipidemia: Secondary | ICD-10-CM | POA: Diagnosis not present

## 2023-06-29 DIAGNOSIS — N182 Chronic kidney disease, stage 2 (mild): Secondary | ICD-10-CM | POA: Diagnosis not present

## 2023-06-29 DIAGNOSIS — Z0000 Encounter for general adult medical examination without abnormal findings: Secondary | ICD-10-CM | POA: Diagnosis not present

## 2023-06-29 DIAGNOSIS — I700 Atherosclerosis of aorta: Secondary | ICD-10-CM | POA: Diagnosis not present

## 2023-06-29 DIAGNOSIS — I10 Essential (primary) hypertension: Secondary | ICD-10-CM | POA: Diagnosis not present

## 2023-07-06 ENCOUNTER — Ambulatory Visit (HOSPITAL_COMMUNITY): Admission: RE | Admit: 2023-07-06 | Source: Ambulatory Visit | Admitting: Gastroenterology

## 2023-07-06 ENCOUNTER — Encounter (HOSPITAL_COMMUNITY): Admission: RE | Payer: Self-pay | Source: Ambulatory Visit

## 2023-07-06 ENCOUNTER — Encounter (INDEPENDENT_AMBULATORY_CARE_PROVIDER_SITE_OTHER): Payer: Self-pay | Admitting: *Deleted

## 2023-07-06 SURGERY — COLONOSCOPY
Anesthesia: Choice

## 2023-07-06 NOTE — OR Nursing (Signed)
 Attempted to call patient to see if he was coming for his colonoscopy today. Patient unable to be reached.

## 2023-11-03 DIAGNOSIS — Z6828 Body mass index (BMI) 28.0-28.9, adult: Secondary | ICD-10-CM | POA: Diagnosis not present

## 2023-11-03 DIAGNOSIS — Z008 Encounter for other general examination: Secondary | ICD-10-CM | POA: Diagnosis not present

## 2023-11-03 DIAGNOSIS — I10 Essential (primary) hypertension: Secondary | ICD-10-CM | POA: Diagnosis not present

## 2023-11-03 DIAGNOSIS — F17210 Nicotine dependence, cigarettes, uncomplicated: Secondary | ICD-10-CM | POA: Diagnosis not present

## 2023-11-03 DIAGNOSIS — M1990 Unspecified osteoarthritis, unspecified site: Secondary | ICD-10-CM | POA: Diagnosis not present

## 2023-11-03 DIAGNOSIS — E663 Overweight: Secondary | ICD-10-CM | POA: Diagnosis not present

## 2023-12-07 ENCOUNTER — Encounter (INDEPENDENT_AMBULATORY_CARE_PROVIDER_SITE_OTHER): Payer: Self-pay | Admitting: Gastroenterology
